# Patient Record
Sex: Female | Born: 1945 | Race: White | Hispanic: No | Smoking: Former smoker
Health system: Southern US, Community
[De-identification: ages and names within clinical notes are randomized; demographics above are authoritative.]

## PROBLEM LIST (undated history)

## (undated) DIAGNOSIS — I872 Venous insufficiency (chronic) (peripheral): Secondary | ICD-10-CM

## (undated) DIAGNOSIS — M8580 Other specified disorders of bone density and structure, unspecified site: Secondary | ICD-10-CM

## (undated) DIAGNOSIS — IMO0002 Reserved for concepts with insufficient information to code with codable children: Secondary | ICD-10-CM

## (undated) DIAGNOSIS — T7840XA Allergy, unspecified, initial encounter: Secondary | ICD-10-CM

## (undated) DIAGNOSIS — M159 Polyosteoarthritis, unspecified: Secondary | ICD-10-CM

## (undated) DIAGNOSIS — E785 Hyperlipidemia, unspecified: Secondary | ICD-10-CM

## (undated) HISTORY — DX: Venous insufficiency (chronic) (peripheral): I87.2

## (undated) HISTORY — DX: Other specified disorders of bone density and structure, unspecified site: M85.80

## (undated) HISTORY — DX: Polyosteoarthritis, unspecified: M15.9

## (undated) HISTORY — PX: CHOLECYSTECTOMY: SHX55

## (undated) HISTORY — DX: Allergy, unspecified, initial encounter: T78.40XA

## (undated) HISTORY — PX: NASAL SEPTUM SURGERY: SHX37

## (undated) HISTORY — PX: ABDOMINAL HYSTERECTOMY: SHX81

## (undated) HISTORY — PX: COLONOSCOPY: SHX174

## (undated) HISTORY — DX: Hyperlipidemia, unspecified: E78.5

## (undated) HISTORY — DX: Reserved for concepts with insufficient information to code with codable children: IMO0002

---

## 1998-11-12 DIAGNOSIS — IMO0002 Reserved for concepts with insufficient information to code with codable children: Secondary | ICD-10-CM

## 1998-11-12 HISTORY — DX: Reserved for concepts with insufficient information to code with codable children: IMO0002

## 2000-10-14 ENCOUNTER — Encounter: Payer: Self-pay | Admitting: Internal Medicine

## 2003-03-17 ENCOUNTER — Encounter: Payer: Self-pay | Admitting: Internal Medicine

## 2004-08-31 ENCOUNTER — Ambulatory Visit: Payer: Self-pay | Admitting: Internal Medicine

## 2004-10-17 ENCOUNTER — Ambulatory Visit: Payer: Self-pay | Admitting: Internal Medicine

## 2005-08-02 ENCOUNTER — Ambulatory Visit: Payer: Self-pay | Admitting: Internal Medicine

## 2005-08-13 ENCOUNTER — Ambulatory Visit: Payer: Self-pay | Admitting: Internal Medicine

## 2005-08-21 ENCOUNTER — Ambulatory Visit: Payer: Self-pay | Admitting: Gastroenterology

## 2005-08-29 ENCOUNTER — Ambulatory Visit: Payer: Self-pay | Admitting: Internal Medicine

## 2005-09-04 ENCOUNTER — Ambulatory Visit: Payer: Self-pay | Admitting: Gastroenterology

## 2006-01-31 ENCOUNTER — Ambulatory Visit: Payer: Self-pay | Admitting: Internal Medicine

## 2006-03-07 ENCOUNTER — Ambulatory Visit: Payer: Self-pay | Admitting: Internal Medicine

## 2006-08-05 ENCOUNTER — Ambulatory Visit: Payer: Self-pay | Admitting: Internal Medicine

## 2006-08-14 ENCOUNTER — Ambulatory Visit: Payer: Self-pay | Admitting: Internal Medicine

## 2007-07-23 DIAGNOSIS — M949 Disorder of cartilage, unspecified: Secondary | ICD-10-CM

## 2007-07-23 DIAGNOSIS — I872 Venous insufficiency (chronic) (peripheral): Secondary | ICD-10-CM | POA: Insufficient documentation

## 2007-07-23 DIAGNOSIS — J309 Allergic rhinitis, unspecified: Secondary | ICD-10-CM | POA: Insufficient documentation

## 2007-07-23 DIAGNOSIS — N3090 Cystitis, unspecified without hematuria: Secondary | ICD-10-CM | POA: Insufficient documentation

## 2007-07-23 DIAGNOSIS — M899 Disorder of bone, unspecified: Secondary | ICD-10-CM | POA: Insufficient documentation

## 2007-08-07 ENCOUNTER — Ambulatory Visit: Payer: Self-pay | Admitting: Internal Medicine

## 2007-08-07 LAB — CONVERTED CEMR LAB
BUN: 20 mg/dL (ref 6–23)
CO2: 30 meq/L (ref 19–32)
Creatinine, Ser: 0.9 mg/dL (ref 0.4–1.2)
Glucose, Bld: 90 mg/dL (ref 70–99)
Phosphorus: 3.1 mg/dL (ref 2.3–4.6)
Potassium: 3.8 meq/L (ref 3.5–5.1)
Sodium: 139 meq/L (ref 135–145)

## 2007-08-19 ENCOUNTER — Encounter: Payer: Self-pay | Admitting: Internal Medicine

## 2007-08-19 ENCOUNTER — Ambulatory Visit: Payer: Self-pay | Admitting: Internal Medicine

## 2007-08-25 ENCOUNTER — Encounter (INDEPENDENT_AMBULATORY_CARE_PROVIDER_SITE_OTHER): Payer: Self-pay | Admitting: *Deleted

## 2007-09-05 ENCOUNTER — Telehealth (INDEPENDENT_AMBULATORY_CARE_PROVIDER_SITE_OTHER): Payer: Self-pay | Admitting: *Deleted

## 2007-11-04 ENCOUNTER — Ambulatory Visit: Payer: Self-pay | Admitting: Internal Medicine

## 2007-11-07 ENCOUNTER — Ambulatory Visit: Payer: Self-pay | Admitting: *Deleted

## 2008-01-05 ENCOUNTER — Telehealth (INDEPENDENT_AMBULATORY_CARE_PROVIDER_SITE_OTHER): Payer: Self-pay | Admitting: *Deleted

## 2008-05-07 ENCOUNTER — Telehealth (INDEPENDENT_AMBULATORY_CARE_PROVIDER_SITE_OTHER): Payer: Self-pay | Admitting: *Deleted

## 2008-08-10 ENCOUNTER — Ambulatory Visit: Payer: Self-pay | Admitting: Internal Medicine

## 2008-08-12 LAB — CONVERTED CEMR LAB
Albumin: 3.7 g/dL (ref 3.5–5.2)
BUN: 22 mg/dL (ref 6–23)
CO2: 29 meq/L (ref 19–32)
Calcium: 9.1 mg/dL (ref 8.4–10.5)
Direct LDL: 171.4 mg/dL
Eosinophils Relative: 1.7 % (ref 0.0–5.0)
GFR calc non Af Amer: 77 mL/min
Glucose, Bld: 84 mg/dL (ref 70–99)
HCT: 41.7 % (ref 36.0–46.0)
Hemoglobin: 14.7 g/dL (ref 12.0–15.0)
Lymphocytes Relative: 38.3 % (ref 12.0–46.0)
Monocytes Absolute: 0.5 10*3/uL (ref 0.1–1.0)
Monocytes Relative: 10.2 % (ref 3.0–12.0)
Neutro Abs: 2.5 10*3/uL (ref 1.4–7.7)
Potassium: 3.9 meq/L (ref 3.5–5.1)
RDW: 12.1 % (ref 11.5–14.6)
Sodium: 137 meq/L (ref 135–145)
Total CHOL/HDL Ratio: 6.3
VLDL: 62 mg/dL — ABNORMAL HIGH (ref 0–40)
WBC: 5 10*3/uL (ref 4.5–10.5)

## 2008-08-19 ENCOUNTER — Encounter: Payer: Self-pay | Admitting: Internal Medicine

## 2008-08-19 ENCOUNTER — Ambulatory Visit: Payer: Self-pay | Admitting: Internal Medicine

## 2008-08-31 ENCOUNTER — Encounter: Payer: Self-pay | Admitting: Internal Medicine

## 2008-08-31 ENCOUNTER — Ambulatory Visit: Payer: Self-pay | Admitting: Internal Medicine

## 2009-01-05 ENCOUNTER — Ambulatory Visit: Payer: Self-pay | Admitting: Family Medicine

## 2009-01-05 LAB — CONVERTED CEMR LAB
Glucose, Urine, Semiquant: NEGATIVE
Ketones, urine, test strip: NEGATIVE
Specific Gravity, Urine: 1.015
pH: 6.5

## 2009-01-06 ENCOUNTER — Encounter (INDEPENDENT_AMBULATORY_CARE_PROVIDER_SITE_OTHER): Payer: Self-pay | Admitting: Internal Medicine

## 2009-08-17 ENCOUNTER — Ambulatory Visit: Payer: Self-pay | Admitting: Internal Medicine

## 2009-08-17 DIAGNOSIS — E785 Hyperlipidemia, unspecified: Secondary | ICD-10-CM | POA: Insufficient documentation

## 2009-08-19 LAB — CONVERTED CEMR LAB
Alkaline Phosphatase: 63 units/L (ref 39–117)
BUN: 22 mg/dL (ref 6–23)
Bilirubin, Direct: 0.1 mg/dL (ref 0.0–0.3)
CO2: 29 meq/L (ref 19–32)
Calcium: 9.7 mg/dL (ref 8.4–10.5)
Chloride: 99 meq/L (ref 96–112)
Creatinine, Ser: 0.8 mg/dL (ref 0.4–1.2)
Direct LDL: 179.1 mg/dL
Eosinophils Absolute: 0.1 10*3/uL (ref 0.0–0.7)
GFR calc non Af Amer: 76.88 mL/min (ref >60)
HCT: 43.5 % (ref 36.0–46.0)
HDL: 45.3 mg/dL (ref >39.00)
Hemoglobin: 15.2 g/dL — ABNORMAL HIGH (ref 12.0–15.0)
MCV: 91.6 fL (ref 78.0–100.0)
Monocytes Relative: 9.8 % (ref 3.0–12.0)
Potassium: 4 meq/L (ref 3.5–5.1)
RDW: 12.3 % (ref 11.5–14.6)
TSH: 2.89 microintl units/mL (ref 0.35–5.50)
Total Bilirubin: 1.3 mg/dL — ABNORMAL HIGH (ref 0.3–1.2)
Total CHOL/HDL Ratio: 6
VLDL: 60.4 mg/dL — ABNORMAL HIGH (ref 0.0–40.0)

## 2009-09-06 ENCOUNTER — Ambulatory Visit: Payer: Self-pay | Admitting: Internal Medicine

## 2009-09-06 ENCOUNTER — Encounter: Payer: Self-pay | Admitting: Internal Medicine

## 2009-09-09 ENCOUNTER — Encounter: Payer: Self-pay | Admitting: Internal Medicine

## 2009-09-26 ENCOUNTER — Ambulatory Visit: Payer: Self-pay | Admitting: Internal Medicine

## 2010-02-07 ENCOUNTER — Ambulatory Visit: Payer: Self-pay | Admitting: Family Medicine

## 2010-02-07 LAB — CONVERTED CEMR LAB: Rapid Strep: NEGATIVE

## 2010-07-10 ENCOUNTER — Ambulatory Visit: Payer: Self-pay | Admitting: Internal Medicine

## 2010-10-09 ENCOUNTER — Ambulatory Visit: Payer: Self-pay | Admitting: Internal Medicine

## 2010-10-13 LAB — CONVERTED CEMR LAB
ALT: 18 units/L (ref 0–35)
AST: 24 units/L (ref 0–37)
BUN: 26 mg/dL — ABNORMAL HIGH (ref 6–23)
Basophils Absolute: 0 10*3/uL (ref 0.0–0.1)
Bilirubin, Direct: 0.1 mg/dL (ref 0.0–0.3)
Calcium: 9.5 mg/dL (ref 8.4–10.5)
Creatinine, Ser: 0.9 mg/dL (ref 0.4–1.2)
Direct LDL: 150.4 mg/dL
Eosinophils Absolute: 0.1 10*3/uL (ref 0.0–0.7)
GFR calc non Af Amer: 70.46 mL/min (ref >60)
Glucose, Bld: 95 mg/dL (ref 70–99)
HDL: 48.6 mg/dL (ref >39.00)
Hemoglobin: 14.4 g/dL (ref 12.0–15.0)
Lymphocytes Relative: 35 % (ref 12.0–46.0)
MCHC: 34.9 g/dL (ref 30.0–36.0)
Monocytes Relative: 9.2 % (ref 3.0–12.0)
Neutrophils Relative %: 53.3 % (ref 43.0–77.0)
Phosphorus: 3.5 mg/dL (ref 2.3–4.6)
Platelets: 257 10*3/uL (ref 150.0–400.0)
Potassium: 4.2 meq/L (ref 3.5–5.1)
RDW: 12.5 % (ref 11.5–14.6)
TSH: 3.15 microintl units/mL (ref 0.35–5.50)
Total Protein: 7 g/dL (ref 6.0–8.3)

## 2010-10-18 ENCOUNTER — Ambulatory Visit: Payer: Self-pay | Admitting: Sports Medicine

## 2010-10-18 DIAGNOSIS — M719 Bursopathy, unspecified: Secondary | ICD-10-CM

## 2010-10-18 DIAGNOSIS — M67919 Unspecified disorder of synovium and tendon, unspecified shoulder: Secondary | ICD-10-CM | POA: Insufficient documentation

## 2010-10-18 DIAGNOSIS — M25519 Pain in unspecified shoulder: Secondary | ICD-10-CM | POA: Insufficient documentation

## 2010-11-01 ENCOUNTER — Ambulatory Visit: Payer: Self-pay | Admitting: Sports Medicine

## 2010-11-08 ENCOUNTER — Ambulatory Visit: Payer: Self-pay | Admitting: Internal Medicine

## 2010-11-08 ENCOUNTER — Encounter: Payer: Self-pay | Admitting: Internal Medicine

## 2010-11-09 ENCOUNTER — Encounter: Payer: Self-pay | Admitting: Internal Medicine

## 2010-12-11 ENCOUNTER — Ambulatory Visit
Admission: RE | Admit: 2010-12-11 | Discharge: 2010-12-11 | Payer: Self-pay | Source: Home / Self Care | Attending: Internal Medicine | Admitting: Internal Medicine

## 2010-12-12 NOTE — Assessment & Plan Note (Signed)
Summary: CPX/CLE   Vital Signs:  Patient profile:   65 year old female Weight:      172 pounds Temp:     98.4 degrees F oral Pulse rate:   64 / minute Pulse rhythm:   regular BP sitting:   118 / 70  (left arm) Cuff size:   regular  Vitals Entered By: Mervin Hack CMA Duncan Dull) (October 09, 2010 3:14 PM) CC: adult physical   History of Present Illness: DOing okay in general  Still having some trouble going down steps---thinks it may have been sciatica since it went down lateral leg. Now mostly better Is going to see ortho next week Also problems with left shoulder---esp posteriorly  Partner has not been doing that great appetite is off and losing weight Is seen quarterly by oncology for his lung cancer  still gets the swelling in legs uses compression hose and the diuretic daily  Allergies: 1)  ! Macrodantin (Nitrofurantoin Macrocrystal)  Past History:  Past medical, surgical, family and social histories (including risk factors) reviewed for relevance to current acute and chronic problems.  Past Medical History: Reviewed history from 08/17/2009 and no changes required. Allergic rhinitis Osteopenia Recurrent cystitis Squamous cell carcinoma of right leg-- ~2000 Chronic venous insufficiency Hyperlipidemia  Past Surgical History: Reviewed history from 07/23/2007 and no changes required. Hysterectomy Nasal septum repair DEXA 10/03 DEXA- fairly stable 10/05  Family History: Reviewed history from 07/23/2007 and no changes required. Dad died @44  MI Mom with RA and HTN Mat aunt died of some cancer Mat GM died of CVA  Social History: Reviewed history from 08/07/2007 and no changes required. 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 Eyes:  Denies double vision and vision loss-1 eye. ENT:  Denies decreased hearing and ringing in ears; teeth okay--regular wtih dentist. CV:  Complains of palpitations; denies chest pain or discomfort, difficulty breathing at night, difficulty breathing while lying down, fainting, lightheadness, and shortness of breath with exertion; rare racing sensation at night--may be just that she can hear it. Very brief. Resp:  Denies cough and shortness of breath. GI:  Denies abdominal pain, bloody stools, change in bowel habits, dark tarry stools, indigestion, nausea, and vomiting. GU:  Complains of nocturia; denies dysuria and incontinence; occ nocturia No sexual problems. MS:  See HPI; Complains of joint pain; denies joint swelling; toe pain also tylenol helps when she takes it--she thinks. Derm:  Denies lesion(s) and rash; some "folliculitis" in hair--variable. Neuro:  Denies headaches, numbness, tingling, and weakness. Psych:  Denies anxiety and depression; has lots of stress wtih mom, daughter and her caregiving. Heme:  Denies abnormal bruising and enlarge lymph nodes. Allergy:  Denies seasonal allergies and sneezing.  Physical Exam  General:  alert and normal appearance.   Eyes:  pupils equal, pupils round, pupils reactive to light, and no optic disk abnormalities.   Ears:  R ear normal and L ear normal.   Mouth:  no erythema, no exudates, and no lesions.   Neck:  supple, no masses, no thyromegaly, no carotid bruits, and no cervical lymphadenopathy.   Breasts:  no abnormal thickening, no tenderness, and no adenopathy.  Mild bilateral cystic changes Lungs:  normal respiratory effort, no intercostal retractions, no accessory muscle use, and  normal breath sounds.   Heart:  normal rate, regular rhythm, no murmur, and no gallop.   Abdomen:  soft, non-tender, and no masses.   Msk:  no joint tenderness and no joint swelling.    Pulses:  1+ in feet Extremities:  no edema Neurologic:  alert & oriented X3, strength normal in all extremities, and gait normal.   Skin:  no rashes, no suspicious lesions, and no ulcerations.   Psych:  normally interactive, good eye contact, not anxious appearing, and not depressed appearing.     Impression & Recommendations:  Problem # 1:  PREVENTIVE HEALTH CARE (ICD-V70.0) Assessment Comment Only doing okay lots of ongoing caregiver stress discussed fitness due for mammo  Problem # 2:  VENOUS INSUFFICIENCY (ICD-459.81) Assessment: Comment Only  okay with diuretic will check labs  Orders: TLB-Renal Function Panel (80069-RENAL) TLB-CBC Platelet - w/Differential (85025-CBCD) TLB-TSH (Thyroid Stimulating Hormone) (84443-TSH)  Problem # 3:  HYPERLIPIDEMIA (ICD-272.4) Assessment: Comment Only  on fish oil will recheck no other meds for now  Labs Reviewed: SGOT: 37 (08/17/2009)   SGPT: 36 (08/17/2009)   HDL:45.30 (08/17/2009), 42.7 (08/10/2008)  LDL:DEL (08/10/2008)  Chol:285 (08/17/2009), 270 (08/10/2008)  Trig:302.0 (08/17/2009), 312 (08/10/2008)  Orders: TLB-Lipid Panel (80061-LIPID) TLB-Hepatic/Liver Function Pnl (80076-HEPATIC) Venipuncture (04540)  Complete Medication List: 1)  Premarin 0.625 Mg Tabs (Estrogens conjugated) .... Take one by mouth monday and thursday 2)  Triamterene-hctz 37.5-25 Mg Caps (Triamterene-hctz) .... Take one by mouth once a day 3)  Multivitamins Tabs (Multiple vitamin) .... Take one by mouth once a day 4)  Fish Oil 1200 Mg Caps (Omega-3 fatty acids) .... Take one by mouth daily 5)  Calcium 500 Mg Tabs (Calcium carbonate) .... Take 1 by mouth once daily 6)  Vitamin D 1000 Unit Tabs (Cholecalciferol) .... Take 2 by mouth once daily 7)  Melatonin 3 Mg Tabs (Melatonin) .... Take 1 by mouth once daily  Other Orders: Radiology Referral (Radiology)  Patient Instructions: 1)  Please schedule a follow-up appointment in 1 year.  2)   Schedule your mammogram.    Orders Added: 1)  Est. Patient 40-64 years [99396] 2)  Radiology Referral [Radiology] 3)  TLB-Lipid Panel [80061-LIPID] 4)  TLB-Hepatic/Liver Function Pnl [80076-HEPATIC] 5)  Venipuncture [36415] 6)  TLB-Renal Function Panel [80069-RENAL] 7)  TLB-CBC Platelet - w/Differential [85025-CBCD] 8)  TLB-TSH (Thyroid Stimulating Hormone) [98119-JYN]   Immunization History:  Influenza Immunization History:    Influenza:  historical (08/12/2010)   Immunization History:  Influenza Immunization History:    Influenza:  Historical (08/12/2010)  Current Allergies (reviewed today): ! MACRODANTIN (NITROFURANTOIN MACROCRYSTAL)

## 2010-12-12 NOTE — Assessment & Plan Note (Signed)
Summary: ST,EAR PAIN/CLE   Vital Signs:  Patient profile:   65 year old female Height:      67 inches Weight:      174.6 pounds BMI:     27.45 Temp:     98.0 degrees F oral Pulse rate:   68 / minute Pulse rhythm:   regular BP sitting:   108 / 78  (left arm) Cuff size:   regular  Vitals Entered By: Benny Lennert CMA [facility]) (February 07, 2010 10:16 AM)  History of Present Illness: Chief complaint Sore throat and ear pain on left side  Acute Visit History:      The patient complains of earache and sore throat.  These symptoms began one day ago.  She denies chest pain, cough, fever, and sinus problems.  Other comments include: left throat, left ear pain grandaughter with strep several weeks ago. .        The earache is located on the left side.  There is no history of recent antibiotic usage associated with the earache.        Problems Prior to Update: 1)  Cellulitis, Face  (ICD-682.0) 2)  Hyperlipidemia  (ICD-272.4) 3)  Abnormal Mammogram  (ICD-793.80) 4)  Screening For Lipoid Disorders  (ICD-V77.91) 5)  Uri  (ICD-465.9) 6)  Preventive Health Care  (ICD-V70.0) 7)  Screening For Mlig Neop, Breast, Nos  (ICD-V76.10) 8)  Carcinoma, Squamous Cell, Hx Of, Right Leg  (ICD-V10.9) 9)  Venous Insufficiency  (ICD-459.81) 10)  Cystitis, Recurrent  (ICD-595.9) 11)  Osteopenia  (ICD-733.90) 12)  Allergic Rhinitis  (ICD-477.9)  Current Medications (verified): 1)  Premarin 0.625 Mg Tabs (Estrogens Conjugated) .... Take One By Mouth Monday and Thursday 2)  Triamterene-Hctz 37.5-25 Mg Caps (Triamterene-Hctz) .... Take One By Mouth Once A Day 3)  Multivitamins   Tabs (Multiple Vitamin) .... Take One By Mouth Once A Day 4)  Fish Oil 1200 Mg Caps (Omega-3 Fatty Acids) .... Take One By Mouth Daily 5)  Calcium 500 Mg Tabs (Calcium Carbonate) .... Take 1 By Mouth Once Daily 6)  Vitamin D 1000 Unit Tabs (Cholecalciferol) .... Take 1 By Mouth Once Daily 7)  Melatonin 3 Mg Tabs (Melatonin) .... Take  1 By Mouth Once Daily  Allergies: 1)  ! Macrodantin (Nitrofurantoin Macrocrystal)  Past History:  Past medical, surgical, family and social histories (including risk factors) reviewed, and no changes noted (except as noted below).  Past Medical History: Reviewed history from 08/17/2009 and no changes required. Allergic rhinitis Osteopenia Recurrent cystitis Squamous cell carcinoma of right leg-- ~2000 Chronic venous insufficiency Hyperlipidemia  Past Surgical History: Reviewed history from 07/23/2007 and no changes required. Hysterectomy Nasal septum repair DEXA 10/03 DEXA- fairly stable 10/05  Family History: Reviewed history from 07/23/2007 and no changes required. Dad died @44  MI Mom with RA and HTN Mat aunt died of some cancer Mat GM died of CVA  Social History: Reviewed history from 08/07/2007 and no changes required. 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 CV:  Denies chest pain or discomfort. Resp:  Denies shortness of breath. GI:  Denies abdominal pain.  Physical Exam  General:  Well-developed,well-nourished,in no acute distress; alert,appropriate and cooperative throughout examination Head:  no maxillary sinus ttp Nose:  External nasal examination shows no deformity or inflammation. Nasal mucosa are pink and moist  without lesions or exudates. Mouth:  no exudates, no posterior lymphoid hypertrophy, no postnasal drip, and pharyngeal erythema.   Neck:  no carotid bruit or thyromegaly no cervical or supraclavicular lymphadenopathy  Lungs:  Normal respiratory effort, chest expands symmetrically. Lungs are clear to auscultation, no crackles or wheezes. Heart:  Normal rate and regular rhythm. S1 and S2 normal without gallop, murmur, click, rub or  other extra sounds.   Impression & Recommendations:  Problem # 1:  URI (ICD-465.9)  Viral pharyngitis.  Instructed on symptomatic treatment. Call if symptoms persist or worsen.  Discussed viral timeline.   Orders: Rapid Strep (84696)  Complete Medication List: 1)  Premarin 0.625 Mg Tabs (Estrogens conjugated) .... Take one by mouth monday and thursday 2)  Triamterene-hctz 37.5-25 Mg Caps (Triamterene-hctz) .... Take one by mouth once a day 3)  Multivitamins Tabs (Multiple vitamin) .... Take one by mouth once a day 4)  Fish Oil 1200 Mg Caps (Omega-3 fatty acids) .... Take one by mouth daily 5)  Calcium 500 Mg Tabs (Calcium carbonate) .... Take 1 by mouth once daily 6)  Vitamin D 1000 Unit Tabs (Cholecalciferol) .... Take 1 by mouth once daily 7)  Melatonin 3 Mg Tabs (Melatonin) .... Take 1 by mouth once daily  Laboratory Results    Other Tests  Rapid Strep: negative  Kit Test Internal QC: Negative   (Normal Range: Negative)   Current Allergies (reviewed today): ! MACRODANTIN (NITROFURANTOIN MACROCRYSTAL)

## 2010-12-12 NOTE — Assessment & Plan Note (Signed)
Summary: 2:15 PAIN IN KNEES/CLE r/s from 07/03/10   Vital Signs:  Patient profile:   65 year old female Weight:      175 pounds Temp:     98.2 degrees F oral Pulse rate:   72 / minute Pulse rhythm:   regular BP sitting:   110 / 68  (left arm) Cuff size:   regular  Vitals Entered By: Mervin Hack CMA [facility] Dull) (July 10, 2010 2:19 PM) CC: knee pain [id number]   History of Present Illness: Having trouble getting up steps Gets pain along lateral legs above knee and into calves mostly diffuse on right Does have pain more localized along lateral left knee  Started a couple of months ago no injury Same shoes no new tasks  No falls Still able to get up steps Has been very sedentary for the past 6 months or so  Allergies: 1)  ! Macrodantin (Nitrofurantoin Macrocrystal)  Past History:  Past medical, surgical, family and social histories (including risk factors) reviewed for relevance to current acute and chronic problems.  Past Medical History: Reviewed history from 08/17/2009 and no changes required. Allergic rhinitis Osteopenia Recurrent cystitis Squamous cell carcinoma of right leg-- ~2000 Chronic venous insufficiency Hyperlipidemia  Past Surgical History: Reviewed history from 07/23/2007 and no changes required. Hysterectomy Nasal septum repair DEXA 10/03 DEXA- fairly stable 10/05  Family History: Reviewed history from 07/23/2007 and no changes required. Dad died @44  MI Mom with RA and HTN Mat aunt died of some cancer Mat GM died of CVA  Social History: Reviewed history from 08/07/2007 and no changes required. 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   Msk:  Normal ROM at both hips and knees No meniscus or ligament findings in either knee ??very slight effusion on right (not clear cut) Pulses:  1+ in each foot Extremities:  no sig edema with hose on no calf tenderness Neurologic:  strength normal in all extremities and gait normal.   Psych:  normally interactive, good eye contact, not anxious appearing, and not depressed appearing.     Impression & Recommendations:  Problem # 1:  LEG PAIN (ICD-729.5) Assessment New new problem but limited findings not clearly arthritis No obvious sciatica Knees are stable Hip ROM normal without pain  may just be muscular problems going up steps could indicate meniscus problems but nothing clear cut on exam  P: discussed exercise regimen      will set up with Dr Patsy Lager if doesn't improve  Complete Medication List: 1)  Premarin 0.625 Mg Tabs (Estrogens conjugated) .... Take one by mouth monday and thursday 2)  Triamterene-hctz 37.5-25 Mg Caps (Triamterene-hctz) .... Take one by mouth once a day 3)  Multivitamins Tabs (Multiple vitamin) .... Take one by mouth once a day 4)  Fish Oil 1200 Mg Caps (Omega-3 fatty acids) .... Take one by mouth daily 5)  Calcium 500 Mg Tabs (Calcium carbonate) .... Take 1 by mouth once daily 6)  Vitamin D 1000 Unit Tabs (Cholecalciferol) .... Take 2 by mouth once daily 7)  Melatonin 3 Mg Tabs (Melatonin) .... Take 1 by mouth once daily  Patient Instructions: 1)  Please call  for an appt with Dr Patsy Lager if your legs are not feeling better in the next few weeks 2)  Please keep appt for physical  Current Allergies (reviewed today): ! MACRODANTIN (NITROFURANTOIN MACROCRYSTAL)

## 2010-12-12 NOTE — Assessment & Plan Note (Signed)
Summary: NP,LEG PAIN,MC   Vital Signs:  Patient profile:   65 year old female Height:      67 inches Weight:      170 pounds BP sitting:   119 / 78  Vitals Entered By: Lillia Pauls CMA (October 18, 2010 10:03 AM)  Primary Provider:  Alphonsus Sias    History of Present Illness: Patient has had sxs that cause RT knee issues feels unstable going up steps cautious w full weight sometimes same going down steps and bending knee  - that feels more like MM pain pain  will go to lat calf and thigh at times no swelling, locking or redness still feels unstable  Lt shoulder good movement but if she reaches back to put on a coat pain shoots down into upper arm  Neck  some tightness and worse w stress/ no numbness into fingers  Allergies: 1)  ! Macrodantin (Nitrofurantoin Macrocrystal)  Physical Exam  General:  Well-developed,well-nourished,in no acute distress; alert,appropriate and cooperative throughout examination Msk:  Rt knee suprapatellar pouch fullness, none on left rt knee slightly warm left knee exam shows no effusion; stable ligaments; negative Mcmurray's and provocative meniscal tests; non painful patellar compression; patellar and quadriceps tendons unremarkable. Rt knee full flexion, negative Mcmurray's Lt hip abduction strong Rt hip abduction weak Neg rt straight leg raise with very good flexibility  Full abduction and elevation of both shoulders Good internal rotation bilat shoulders Limited external rotation of left shoulder Weakness on external rotation on left shoulder Good strength on internal rotation bilat Neg empty can Good abduction and elevation strength  Additional Exam:  MSK [place] no tears noted in either med or lat meniscus pateallr tendon has small tear prox that appears old no inc in blood flow quad tendon intact small increase in fluid in suprapatellar pouch   Impression & Recommendations:  Problem # 1:  KNEE PAIN, RIGHT (ICD-719.46)  I  recommended that we try to strengthen hip abductor on RT shich may make knee seem unstable as it is very weak do some quad work as well  reck if persistent pain or mechanical sxs  Orders: [place] LIMITED (04540)  Problem # 2:  SHOULDER PAIN, LEFT (ICD-719.41)  this is very consistently related to external rotation  that is confirmed on exam  will work this with motion exercise and light strength work  Orders: [place] LIMITED (98119)  Problem # 3:  ROTATOR CUFF SYNDROME, LEFT (ICD-726.10)  clearly has involvment of infrapinatus and teres minor on testing  at her age RR of rotator cuff tear is high w her level of weakness  will bring back for scan of this  Orders: Theraband per yard (A9300) [place] LIMITED [phone])  Complete Medication List: 1)  Premarin 0.625 Mg Tabs (Estrogens conjugated) .... Take one by mouth monday and thursday 2)  Triamterene-hctz 37.5-25 Mg Caps (Triamterene-hctz) .... Take one by mouth once a day 3)  Multivitamins Tabs (Multiple vitamin) .... Take one by mouth once a day 4)  Fish Oil 1200 Mg Caps (Omega-3 fatty acids) .... Take one by mouth daily 5)  Calcium 500 Mg Tabs (Calcium carbonate) .... Take 1 by mouth once daily 6)  Vitamin D 1000 Unit Tabs (Cholecalciferol) .... Take 2 by mouth once daily 7)  Melatonin 3 Mg Tabs (Melatonin) .... Take 1 by mouth once daily  Patient Instructions: 1)  Do lateral leg lifts lying on your side- start with 3 sets of 6, and build up to 3 sets of 10  2)  Do standing hip rotations - start with 3 sets of 6, building up to 3 sets of 10 3)  Do straight leg raises- start with 3 sets of 6, building up to 3 sets of 10 4)  Use theraband for external shoulder rotation exercises    Orders Added: 1)  Theraband per yard [A9300] 2)  New Patient Level III [99203] 3)  [place] LIMITED [[id number]]

## 2010-12-14 NOTE — Letter (Signed)
Summary: Results Follow up Letter  [name] [facility]  [address] [place], [place] [address]   Phone: [phone]  Fax: [phone]    11/09/2010 MRN: [id number]  [facility] [address] [place], [place]  [address]  Dear [name],  The following are the results of your recent test(s):  Test         Result    Pap Smear:        Normal _____  Not Normal _____ Comments: ______________________________________________________ Cholesterol: LDL(Bad cholesterol):         Your goal is less than:         HDL (Good cholesterol):       Your goal is more than: Comments:  ______________________________________________________ Mammogram:        Normal __X___  Not Normal _____ Comments:mammo looks fine, repeat recommended in 1-2 years  ___________________________________________________________________ Hemoccult:        Normal _____  Not normal _______ Comments:    _____________________________________________________________________ Other Tests:    We routinely do not discuss normal results over the telephone.  If you desire a copy of the results, or you have any questions about this information we can discuss them at your next office visit.   Sincerely,      Tillman Abide, MD

## 2010-12-14 NOTE — Assessment & Plan Note (Signed)
Summary: [facility]   Primary Provider:  Alphonsus Sias    History of Present Illness: 65 yo F here for Lt shoulder [place]. Seen by Darrick Penna 2 weeks ago for knee pain, but also with shoulder pain now.  Pain with reaching back and overhead. No pain with sleeping on that side.  No restricted ROM.  Has noticed weakness but only mildly.  Fields concerned about RC tear so wanted her to come back for [place]  Allergies: 1)  ! Macrodantin (Nitrofurantoin Macrocrystal)  Physical Exam  General:  Well-developed,well-nourished,in no acute distress; alert,appropriate and cooperative throughout examination Msk:  Lt Shoulder: Inspection reveals no abnormalities, atrophy or asymmetry. Palpation is normal with no tenderness over AC joint or bicipital groove. ROM is full in f flex, abd, IR, but limited to 45 ER with painful endpoint (compared to 75 deg on Rt side) Rotator cuff strength normal throughout. No signs of impingement with negative Neer and Hawkin's tests, empty can. Speeds and Yergason's tests normal. No labral pathology noted with negative Obrien's. Normal scapular function observed. No painful arc and no drop arm sign.  Shoulder [place] limited: BT nl.  Subscap nl, no internal impingement.  SSP with some mild overlying bursitis, no obvious tear, may has small amount of subacromial impingement.  TM and ISP appear normal. AC joint normal.    Impression & Recommendations:  Problem # 1:  ROTATOR CUFF SYNDROME, LEFT (ICD-726.10)  No obvious tear on [place], does not meet criteria for adhesive capsulitis.  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 Take one by mouth monday and thursday 2)  Triamterene-hctz 37.5-25 Mg Caps (Triamterene-hctz) .... Take one by mouth once a day 3)   Multivitamins Tabs (Multiple vitamin) .... Take one by mouth once a day 4)  Fish Oil 1200 Mg Caps (Omega-3 fatty acids) .... Take one by mouth daily 5)  Calcium 500 Mg Tabs (Calcium carbonate) .... Take 1 by mouth once daily 6)  Vitamin D 1000 Unit Tabs (Cholecalciferol) .... Take 2 by mouth once daily 7)  Melatonin 3 Mg Tabs (Melatonin) .... Take 1 by mouth once daily   Orders Added: 1)  Est. Patient Level III [60454] 2)  [place] LIMITED [[id number]]

## 2010-12-20 NOTE — Assessment & Plan Note (Signed)
Summary: 2:15 COUGH/CLE   Vital Signs:  Patient profile:   65 year old female Weight:      173 pounds BMI:     27.19 Temp:     98.6 degrees F oral Pulse rate:   82 / minute Pulse rhythm:   regular BP sitting:   121 / 69  (left arm) Cuff size:   regular  Vitals Entered By: Mervin Hack CMA Duncan Dull) (December 11, 2010 2:27 PM) CC: cough   History of Present Illness: Has had cough for about 4 days Doesn't seem like "traditional" cold No rhinorrhea or head congestion  Seems deep in chest not much sputum---slight green stuff this AM No SOB No sore throat No ear pain  Didn't try any meds  No fever Cough not keeping her up at night  Allergies: 1)  ! Macrodantin (Nitrofurantoin Macrocrystal)  Past History:  Past medical, surgical, family and social histories (including risk factors) reviewed for relevance to current acute and chronic problems.  Past Medical History: Reviewed history from 08/17/2009 and no changes required. Allergic rhinitis Osteopenia Recurrent cystitis Squamous cell carcinoma of right leg-- ~2000 Chronic venous insufficiency Hyperlipidemia  Past Surgical History: Reviewed history from 07/23/2007 and no changes required. Hysterectomy Nasal septum repair DEXA 10/03 DEXA- fairly stable 10/05  Family History: Reviewed history from 07/23/2007 and no changes required. Dad died @44  MI Mom with RA and HTN Mat aunt died of some cancer Mat GM died of CVA  Social History: Reviewed history from 08/07/2007 and no changes required. 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   Head:  No maxillary or frontal tenderness Ears:   R ear normal and L ear normal.   Nose:  mild congestion and inflammation Mouth:  no erythema and no exudates.   Neck:  supple, no masses, and no cervical lymphadenopathy.   Lungs:  normal respiratory effort, no intercostal retractions, no accessory muscle use, normal breath sounds, no crackles, and no wheezes.     Impression & Recommendations:  Problem # 1:  BRONCHITIS- ACUTE (ICD-466.0) Assessment New seems to be viral discussed supportive Rx  she will call in 5-8 days if signs of secondary bacterial infection (would Rx with z-pak)--discussed these with her  Complete Medication List: 1)  Premarin 0.625 Mg Tabs (Estrogens conjugated) .... Take one by mouth monday and thursday 2)  Triamterene-hctz 37.5-25 Mg Caps (Triamterene-hctz) .... Take one by mouth once a day 3)  Multivitamins Tabs (Multiple vitamin) .... Take one by mouth once a day 4)  Fish Oil 1200 Mg Caps (Omega-3 fatty acids) .... Take one by mouth daily 5)  Calcium 500 Mg Tabs (Calcium carbonate) .... Take 1 by mouth once daily 6)  Vitamin D 1000 Unit Tabs (Cholecalciferol) .... Take 2 by mouth once daily 7)  Melatonin 3 Mg Tabs (Melatonin) .... Take 1 by mouth once daily  Patient Instructions: 1)  Please schedule a follow-up appointment as needed .    Orders Added: 1)  Est. Patient Level III [16109]    Current Allergies (reviewed today): ! MACRODANTIN (NITROFURANTOIN MACROCRYSTAL)

## 2011-04-23 ENCOUNTER — Other Ambulatory Visit: Payer: Self-pay | Admitting: *Deleted

## 2011-04-23 MED ORDER — ESTROGENS CONJUGATED 0.625 MG PO TABS: ORAL_TABLET | ORAL | Status: DC

## 2011-04-23 NOTE — Telephone Encounter (Signed)
rx sent to pharmacy by e-script  

## 2011-10-15 ENCOUNTER — Ambulatory Visit (INDEPENDENT_AMBULATORY_CARE_PROVIDER_SITE_OTHER): Payer: Self-pay | Admitting: Internal Medicine

## 2011-10-15 ENCOUNTER — Encounter: Payer: Self-pay | Admitting: Internal Medicine

## 2011-10-15 VITALS — BP 121/66 | HR 89 | Temp 98.0°F | Ht 67.0 in | Wt 178.0 lb

## 2011-10-15 DIAGNOSIS — Z0000 Encounter for general adult medical examination without abnormal findings: Secondary | ICD-10-CM | POA: Insufficient documentation

## 2011-10-15 DIAGNOSIS — M949 Disorder of cartilage, unspecified: Secondary | ICD-10-CM

## 2011-10-15 DIAGNOSIS — M159 Polyosteoarthritis, unspecified: Secondary | ICD-10-CM | POA: Insufficient documentation

## 2011-10-15 DIAGNOSIS — M899 Disorder of bone, unspecified: Secondary | ICD-10-CM

## 2011-10-15 DIAGNOSIS — Z23 Encounter for immunization: Secondary | ICD-10-CM

## 2011-10-15 DIAGNOSIS — I872 Venous insufficiency (chronic) (peripheral): Secondary | ICD-10-CM

## 2011-10-15 DIAGNOSIS — Z1231 Encounter for screening mammogram for malignant neoplasm of breast: Secondary | ICD-10-CM

## 2011-10-15 DIAGNOSIS — Z79899 Other long term (current) drug therapy: Secondary | ICD-10-CM

## 2011-10-15 NOTE — Assessment & Plan Note (Signed)
Continues on diuretic and hose Check labs

## 2011-10-15 NOTE — Progress Notes (Signed)
Subjective:    Patient ID: Sierra Salinas, female    DOB: [birth year], 65 y.o.   MRN: [id number]  HPI Here for physical Still having intolerable hot flashes Went to gyn to try to get off the premarin Now on hormone patch---some reasonable control  Uses the diuretic daily and wears compression hose This keeps her legs under control  Will accept pneumovax Doesn't think she ever had chicken pox Wants to check titer and will consider vaccine if hasn't had, or zostavax if has had  No current outpatient prescriptions on file prior to visit.    Allergies  Allergen Reactions  . Nitrofurantoin     Past Medical History  Diagnosis Date  . Allergy   . Disorder of bone and cartilage, unspecified   . Cystitis     recurrent  . 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 Unspecified venous (peripheral) insufficiency   . Hyperlipidemia     Past Surgical History  Procedure Date  . Abdominal hysterectomy   . Nasal septum surgery     Family History  Problem Relation Age of Onset  . Arthritis Mother   . Hypertension Mother   . Cancer Maternal Aunt   . Stroke Maternal Grandmother     History   Social History  . Marital Status: Single    Spouse Name: N/A    Number of Children: 2  . Years of Education: N/A   Occupational History  . personal assistant    Social History Main Topics  . Smoking status: Former Games developer  . Smokeless tobacco: Never Used  . Alcohol Use: Yes     occassional  . Drug Use: No  . Sexually Active: Not on file   Other Topics Concern  . Not on file   Social History Narrative   Mostly caregives for long term companionHas living willHas designated daughter Gaylyn Rong to be health care POAWould accept resuscitation attempts---no prolonged artificial ventilationWould accept feeding tube for limited time depending on circumstances   Review of Systems  Constitutional: Negative for fatigue and unexpected weight change.       Wears seat belt  HENT: Positive  for hearing loss. Negative for congestion, rhinorrhea, dental problem and tinnitus.        Mild hearing problems in noisy room Regular with dentist  Eyes: Negative for visual disturbance.       No unilateral vision loss or diplopia  Respiratory: Negative for cough, chest tightness and shortness of breath.   Cardiovascular: Positive for leg swelling. Negative for chest pain and palpitations.  Gastrointestinal: Negative for nausea, vomiting, constipation and blood in stool.       No heartburn  Genitourinary: Negative for dysuria, urgency, difficulty urinating and dyspareunia.  Musculoskeletal: Positive for arthralgias. Negative for back pain and gait problem.       Lots of scattered arthritis---has started multiple OTC meds to help this Just started glucosamine-chondroitin---discussed giving it 1-2 months and stopping it if not helpful  Skin: Negative for rash.       Sees derm yearly  Neurological: Negative for dizziness, syncope, weakness, light-headedness, numbness and headaches.  Hematological: Negative for adenopathy. Does not bruise/bleed easily.  Psychiatric/Behavioral: Negative for sleep disturbance and dysphoric mood. The patient is not nervous/anxious.        Sleeps okay with melatonin and benedryl       Objective:   Physical Exam  Constitutional: She is oriented to person, place, and time. She appears well-developed and well-nourished. No distress.  HENT:  Head: Normocephalic and atraumatic.  Right Ear: External ear normal.  Left Ear: External ear normal.  Mouth/Throat: Oropharynx is clear and moist. No oropharyngeal exudate.       TMs normal  Eyes: Conjunctivae and EOM are normal. Pupils are equal, round, and reactive to light.       Fundi benign  Neck: Normal range of motion. Neck supple. No thyromegaly present.  Cardiovascular: Normal rate, regular rhythm, normal heart sounds and intact distal pulses.  Exam reveals no gallop.   No murmur heard. Pulmonary/Chest: Effort  normal and breath sounds normal. No respiratory distress. She has no wheezes. She has no rales.  Abdominal: Soft. There is no tenderness.  Genitourinary:       Breast have mild cystic changes but no worrisome findings  Musculoskeletal: Normal range of motion. She exhibits no edema and no tenderness.  Lymphadenopathy:    She has no cervical adenopathy.    She has no axillary adenopathy.  Neurological: She is alert and oriented to person, place, and time.  Skin: No rash noted. No erythema.       no suspicious lesions  Psychiatric: She has a normal mood and affect. Her behavior is normal. Judgment and thought content normal.          Assessment & Plan:

## 2011-10-15 NOTE — Patient Instructions (Signed)
Please set up mammogram

## 2011-10-15 NOTE — Assessment & Plan Note (Signed)
Healthy Needs to be more active---discussed Will give pneumovax Never had chicken pox---asks for titer before vaccine

## 2011-10-15 NOTE — Assessment & Plan Note (Signed)
On vitamin D Estrogen should help also

## 2011-10-15 NOTE — Assessment & Plan Note (Signed)
Uses ibuprofen or naproxen ??glucosamine--chondroitin helpful

## 2011-10-16 LAB — CBC WITH DIFFERENTIAL/PLATELET
Basophils Absolute: 0 10*3/uL (ref 0.0–0.1)
Eosinophils Absolute: 0.2 10*3/uL (ref 0.0–0.7)
HCT: 40.2 % (ref 36.0–46.0)
Hemoglobin: 13.9 g/dL (ref 12.0–15.0)
Lymphs Abs: 2.1 10*3/uL (ref 0.7–4.0)
MCHC: 34.6 g/dL (ref 30.0–36.0)
MCV: 92.9 fl (ref 78.0–100.0)
Monocytes Absolute: 0.4 10*3/uL (ref 0.1–1.0)
Neutro Abs: 3.9 10*3/uL (ref 1.4–7.7)
Platelets: 262 10*3/uL (ref 150.0–400.0)
RDW: 12.9 % (ref 11.5–14.6)

## 2011-10-16 LAB — HEPATIC FUNCTION PANEL
ALT: 20 U/L (ref 0–35)
AST: 26 U/L (ref 0–37)
Alkaline Phosphatase: 65 U/L (ref 39–117)
Bilirubin, Direct: 0 mg/dL (ref 0.0–0.3)
Total Protein: 7.1 g/dL (ref 6.0–8.3)

## 2011-10-16 LAB — BASIC METABOLIC PANEL
CO2: 26 mEq/L (ref 19–32)
Chloride: 102 mEq/L (ref 96–112)
Creatinine, Ser: 1.1 mg/dL (ref 0.4–1.2)

## 2011-10-31 ENCOUNTER — Other Ambulatory Visit: Payer: Self-pay | Admitting: Internal Medicine

## 2011-12-11 ENCOUNTER — Ambulatory Visit: Payer: Self-pay | Admitting: Internal Medicine

## 2011-12-12 ENCOUNTER — Encounter: Payer: Self-pay | Admitting: Internal Medicine

## 2011-12-17 ENCOUNTER — Encounter: Payer: Self-pay | Admitting: *Deleted

## 2012-10-24 ENCOUNTER — Other Ambulatory Visit: Payer: Self-pay

## 2012-10-24 MED ORDER — TRIAMTERENE-HCTZ 37.5-25 MG PO CAPS: 1.0000 | ORAL_CAPSULE | Freq: Every day | ORAL | Status: DC

## 2012-10-24 NOTE — Telephone Encounter (Signed)
Pt request refill triamterene-HCTZ until CPX 02/10/13. Advised pt refill done to [facility].

## 2013-02-10 ENCOUNTER — Encounter: Payer: Self-pay | Admitting: Internal Medicine

## 2013-02-10 ENCOUNTER — Ambulatory Visit (INDEPENDENT_AMBULATORY_CARE_PROVIDER_SITE_OTHER): Payer: Medicare Other | Admitting: Internal Medicine

## 2013-02-10 VITALS — BP 136/80 | HR 83 | Temp 97.8°F | Ht 67.0 in | Wt 171.0 lb

## 2013-02-10 DIAGNOSIS — I872 Venous insufficiency (chronic) (peripheral): Secondary | ICD-10-CM

## 2013-02-10 DIAGNOSIS — E785 Hyperlipidemia, unspecified: Secondary | ICD-10-CM

## 2013-02-10 DIAGNOSIS — Z0000 Encounter for general adult medical examination without abnormal findings: Secondary | ICD-10-CM

## 2013-02-10 LAB — CBC WITH DIFFERENTIAL/PLATELET
Basophils Relative: 0.7 % (ref 0.0–3.0)
Eosinophils Relative: 1.7 % (ref 0.0–5.0)
MCV: 89.3 fl (ref 78.0–100.0)
Monocytes Absolute: 0.6 10*3/uL (ref 0.1–1.0)
Monocytes Relative: 9.2 % (ref 3.0–12.0)
Neutrophils Relative %: 60.1 % (ref 43.0–77.0)
Platelets: 304 10*3/uL (ref 150.0–400.0)
RBC: 4.66 Mil/uL (ref 3.87–5.11)
WBC: 6.4 10*3/uL (ref 4.5–10.5)

## 2013-02-10 LAB — BASIC METABOLIC PANEL
CO2: 28 mEq/L (ref 19–32)
Chloride: 101 mEq/L (ref 96–112)
Glucose, Bld: 101 mg/dL — ABNORMAL HIGH (ref 70–99)
Potassium: 3.8 mEq/L (ref 3.5–5.1)
Sodium: 138 mEq/L (ref 135–145)

## 2013-02-10 LAB — HEPATIC FUNCTION PANEL
ALT: 15 U/L (ref 0–35)
AST: 21 U/L (ref 0–37)
Alkaline Phosphatase: 59 U/L (ref 39–117)
Bilirubin, Direct: 0.1 mg/dL (ref 0.0–0.3)
Total Bilirubin: 1 mg/dL (ref 0.3–1.2)

## 2013-02-10 LAB — LIPID PANEL: Triglycerides: 271 mg/dL — ABNORMAL HIGH (ref 0.0–149.0)

## 2013-02-10 LAB — TSH: TSH: 2.51 u[IU]/mL (ref 0.35–5.50)

## 2013-02-10 NOTE — Assessment & Plan Note (Signed)
Edema controlled with diuretic and hose Due for labs

## 2013-02-10 NOTE — Assessment & Plan Note (Signed)
Doesn't want meds for primary prevention 

## 2013-02-10 NOTE — Assessment & Plan Note (Signed)
Due for mammo---she will set up Declined zostavax Urged regular exercise---start with walking DASH diet info given

## 2013-02-10 NOTE — Patient Instructions (Addendum)
Please set up your screening mammogram at [facility].  DASH Diet The DASH diet stands for "Dietary Approaches to Stop Hypertension." It is a healthy eating plan that has been shown to reduce high blood pressure (hypertension) in as little as 14 days, while also possibly providing other significant health benefits. These other health benefits include reducing the risk of breast cancer after menopause and reducing the risk of type 2 diabetes, heart disease, colon cancer, and stroke. Health benefits also include weight loss and slowing kidney failure in patients with chronic kidney disease.  DIET GUIDELINES  Limit salt (sodium). Your diet should contain less than 1500 mg of sodium daily.  Limit refined or processed carbohydrates. Your diet should include mostly whole grains. Desserts and added sugars should be used sparingly.  Include small amounts of heart-healthy fats. These types of fats include nuts, oils, and tub margarine. Limit saturated and trans fats. These fats have been shown to be harmful in the body. CHOOSING FOODS  The following food groups are based on a 2000 calorie diet. See your Registered Dietitian for individual calorie needs. Grains and Grain Products (6 to 8 servings daily)  Eat More Often: Whole-wheat bread, brown rice, whole-grain or wheat pasta, quinoa, popcorn without added fat or salt (air popped).  Eat Less Often: White bread, white pasta, white rice, cornbread. Vegetables (4 to 5 servings daily)  Eat More Often: Fresh, frozen, and canned vegetables. Vegetables may be raw, steamed, roasted, or grilled with a minimal amount of fat.  Eat Less Often/Avoid: Creamed or fried vegetables. Vegetables in a cheese sauce. Fruit (4 to 5 servings daily)  Eat More Often: All fresh, canned (in natural juice), or frozen fruits. Dried fruits without added sugar. One hundred percent fruit juice ( cup [237 mL] daily).  Eat Less Often: Dried fruits with added sugar. Canned fruit in  light or heavy syrup. [employer], Fish, and Poultry (2 servings or less daily. One serving is 3 to 4 oz [85-114 g]).  Eat More Often: Ninety percent or leaner ground beef, tenderloin, sirloin. Round cuts of beef, chicken breast, [place] breast. All fish. Grill, bake, or broil your meat. Nothing should be fried.  Eat Less Often/Avoid: Fatty cuts of meat, [place], or chicken leg, thigh, or wing. Fried cuts of meat or fish. Dairy (2 to 3 servings)  Eat More Often: Low-fat or fat-free milk, low-fat plain or light yogurt, reduced-fat or part-skim cheese.  Eat Less Often/Avoid: Milk (whole, 2%).Whole milk yogurt. Full-fat cheeses. Nuts, Seeds, and Legumes (4 to 5 servings per week)  Eat More Often: All without added salt.  Eat Less Often/Avoid: Salted nuts and seeds, canned beans with added salt. Fats and Sweets (limited)  Eat More Often: Vegetable oils, tub margarines without trans fats, sugar-free gelatin. Mayonnaise and salad dressings.  Eat Less Often/Avoid: Coconut oils, palm oils, butter, stick margarine, cream, half and half, cookies, candy, pie. FOR MORE INFORMATION The Dash Diet Eating Plan: www.dashdiet.org Document Released: 10/18/2011 Document Revised: 01/21/2012 Document Reviewed: 10/18/2011 [facility] Patient Information 2013 [place], [place].

## 2013-02-10 NOTE — Progress Notes (Signed)
Subjective:    Patient ID: Sierra Salinas, female    DOB: September 25, 1946, 67 y.o.   MRN: [id number]  HPI Here for physical  Still with long time partner--now 33 Mom now 90--- requires 24/7 sitters. Some stress with this (she is local). Only child  Still sees gynecologist Gets menostar patch and that helps her menopausal symptoms May just switch over to me  Still wears support hose On daily diuretic for edema also  Discussed cholesterol Not interested in meds at this point Discussed lifestyle Eats out a lot  Current Outpatient Prescriptions on File Prior to Visit  Medication Sig Dispense Refill  . calcium citrate-vitamin D 200-200 MG-UNIT TABS Take 1 tablet by mouth daily.        . diphenhydrAMINE (BENADRYL) 25 MG tablet Take 25 mg by mouth at bedtime.        . Melatonin 1 MG TABS Take 1-3 mg by mouth at bedtime as needed.        Marland Kitchen MENOSTAR 14 MCG/24HR Place 1 patch onto the skin once a week.       . Multiple Vitamins-Minerals (CENTRUM SILVER ULTRA WOMENS) TABS Take by mouth daily.        Marland Kitchen triamterene-hydrochlorothiazide (DYAZIDE) 37.5-25 MG per capsule Take 1 each (1 capsule total) by mouth daily.  30 capsule  4   No current facility-administered medications on file prior to visit.    Allergies  Allergen Reactions  . Nitrofurantoin     Past Medical History  Diagnosis Date  . Allergy   . Osteopenia   . Cystitis     recurrent  . 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 Unspecified venous (peripheral) insufficiency   . Hyperlipidemia   . Osteoarthritis of multiple joints     Past Surgical History  Procedure Laterality Date  . Abdominal hysterectomy    . Nasal septum surgery      Family History  Problem Relation Age of Onset  . Arthritis Mother   . Hypertension Mother   . Cancer Maternal Aunt   . Stroke Maternal Grandmother     History   Social History  . Marital Status: Single    Spouse Name: N/A    Number of Children: 2  . Years of Education:  N/A   Occupational History  . personal assistant    Social History Main Topics  . Smoking status: Former Games developer  . Smokeless tobacco: Never Used  . Alcohol Use: Yes     Comment: occassional  . Drug Use: No  . Sexually Active: Not on file   Other Topics Concern  . Not on file   Social History Narrative   Mostly caregives for long term companion   Has living will   Has designated daughter Gaylyn Rong to be health care POA   Would accept resuscitation attempts---no prolonged artificial ventilation   Would accept feeding tube for limited time depending on circumstances   Review of Systems  Constitutional: Positive for unexpected weight change. Negative for fatigue.       Weight down 7#--surprised her Wears seat belt  HENT: Positive for hearing loss. Negative for congestion, rhinorrhea, dental problem and tinnitus.        Regular with dentist  Eyes: Negative for visual disturbance.       No diplopia or unilateral vision loss  Respiratory: Negative for cough, chest tightness and shortness of breath.   Cardiovascular: Positive for leg swelling. Negative for chest pain and palpitations.  Gastrointestinal:  Negative for nausea, vomiting, abdominal pain, constipation and blood in stool.       No heartburn  Endocrine: Positive for heat intolerance.       No change--doesn't like hot weather  Genitourinary: Negative for urgency, frequency, difficulty urinating and dyspareunia.       No sex--no problem (partner unable now)  Musculoskeletal: Positive for arthralgias. Negative for back pain and joint swelling.       Right hip pain going up stairs  Skin: Negative for rash.       No suspicious lesions  Allergic/Immunologic: Negative for environmental allergies and immunocompromised state.  Neurological: Positive for numbness. Negative for dizziness, syncope, weakness, light-headedness and headaches.       Brief numbness in fingers if holding her arm up for a while  Hematological: Negative for  adenopathy. Does not bruise/bleed easily.  Psychiatric/Behavioral: Positive for sleep disturbance. Negative for dysphoric mood. The patient is not nervous/anxious.        Sleeps okay with benedryl-- no AM sedation or other problems       Objective:   Physical Exam  Constitutional: She is oriented to person, place, and time. She appears well-developed and well-nourished. No distress.  HENT:  Head: Normocephalic and atraumatic.  Right Ear: External ear normal.  Left Ear: External ear normal.  Mouth/Throat: Oropharynx is clear and moist. No oropharyngeal exudate.  Eyes: Conjunctivae and EOM are normal. Pupils are equal, round, and reactive to light.  Neck: Normal range of motion. Neck supple. No thyromegaly present.  Cardiovascular: Normal rate, regular rhythm, normal heart sounds and intact distal pulses.  Exam reveals no gallop.   No murmur heard. Pulmonary/Chest: Effort normal and breath sounds normal. No respiratory distress. She has no wheezes. She has no rales.  Abdominal: Soft. There is no tenderness.  Genitourinary:  Breast exam by gyn in January  Musculoskeletal: She exhibits no edema and no tenderness.  Lymphadenopathy:    She has no cervical adenopathy.    She has no axillary adenopathy.  Neurological: She is alert and oriented to person, place, and time.  Skin: No rash noted. No erythema.  Psychiatric: She has a normal mood and affect. Her behavior is normal.          Assessment & Plan:

## 2013-03-09 ENCOUNTER — Ambulatory Visit: Payer: Self-pay | Admitting: Internal Medicine

## 2013-03-10 ENCOUNTER — Encounter: Payer: Self-pay | Admitting: Internal Medicine

## 2013-03-17 ENCOUNTER — Other Ambulatory Visit: Payer: Self-pay | Admitting: Internal Medicine

## 2013-04-21 ENCOUNTER — Other Ambulatory Visit: Payer: Self-pay | Admitting: Internal Medicine

## 2013-09-17 ENCOUNTER — Other Ambulatory Visit: Payer: Self-pay

## 2013-10-27 ENCOUNTER — Encounter: Payer: Self-pay | Admitting: Internal Medicine

## 2013-10-27 ENCOUNTER — Ambulatory Visit (INDEPENDENT_AMBULATORY_CARE_PROVIDER_SITE_OTHER): Payer: Medicare Other | Admitting: Internal Medicine

## 2013-10-27 VITALS — BP 126/70 | HR 74 | Temp 97.8°F | Wt 173.0 lb

## 2013-10-27 DIAGNOSIS — R002 Palpitations: Secondary | ICD-10-CM

## 2013-10-27 LAB — CBC WITH DIFFERENTIAL/PLATELET
Basophils Relative: 0.3 % (ref 0.0–3.0)
Eosinophils Absolute: 0.1 10*3/uL (ref 0.0–0.7)
Eosinophils Relative: 1.4 % (ref 0.0–5.0)
HCT: 43.1 % (ref 36.0–46.0)
Hemoglobin: 14.5 g/dL (ref 12.0–15.0)
Lymphs Abs: 2 10*3/uL (ref 0.7–4.0)
MCHC: 33.7 g/dL (ref 30.0–36.0)
MCV: 90 fl (ref 78.0–100.0)
Monocytes Absolute: 0.7 10*3/uL (ref 0.1–1.0)
Neutro Abs: 4.1 10*3/uL (ref 1.4–7.7)
Neutrophils Relative %: 59.8 % (ref 43.0–77.0)
RBC: 4.78 Mil/uL (ref 3.87–5.11)
WBC: 6.9 10*3/uL (ref 4.5–10.5)

## 2013-10-27 NOTE — Assessment & Plan Note (Signed)
Doesn't sound like SVT EKG is normal BP fine Will not add any meds or do other work up If still having symptoms after the stress of the season is over, would consider further eval  Will just check thyroid function and CBC

## 2013-10-27 NOTE — Patient Instructions (Signed)
Let me know if your heart symptoms persist after the first of the year.

## 2013-10-27 NOTE — Progress Notes (Signed)
Subjective:    Patient ID: Sierra Salinas, female    DOB: 02/01/1946, 67 y.o.   MRN: [id number]  HPI "stress I think"  "anything and everything is happening" Stress during holiday season--"so much extra stuff to do" Daughter not well-- expecting surgery caregiving for mom ---coordinating sitters, etc  Man she takes care of declining---- working on downsizing his living situation  Heartbeat "has been driving me crazy" Sleep has been poor---she feels pounding when in bed Doesn't notice it during the day No skips Fast and pounding--worse when reclining.  Her estimation of rate sounds like no more than 100 Awakens from sleep--- either from stress or from the heart sensation  BP yesterday-- 130/70 but pulse 1001 Stable DOE--like going up steps  Current Outpatient Prescriptions on File Prior to Visit  Medication Sig Dispense Refill  . calcium citrate-vitamin D 200-200 MG-UNIT TABS Take 1 tablet by mouth daily.        . diphenhydrAMINE (BENADRYL) 25 MG tablet Take 25 mg by mouth at bedtime.        . fish oil-omega-3 fatty acids 1000 MG capsule Take 1,200 mg by mouth daily.      . Melatonin 1 MG TABS Take 1-3 mg by mouth at bedtime as needed.        Marland Kitchen MENOSTAR 14 MCG/24HR Place 1 patch onto the skin once a week.       . Multiple Vitamins-Minerals (CENTRUM SILVER ULTRA WOMENS) TABS Take by mouth daily.        . naproxen sodium (ANAPROX) 220 MG tablet Take 220 mg by mouth daily.      Marland Kitchen triamterene-hydrochlorothiazide (DYAZIDE) 37.5-25 MG per capsule TAKE 1 EACH (1 CAPSULE TOTAL) BY MOUTH DAILY.  90 capsule  3   No current facility-administered medications on file prior to visit.    Allergies  Allergen Reactions  . Nitrofurantoin     Past Medical History  Diagnosis Date  . Allergy   . Osteopenia   . Cystitis     recurrent  . 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 Unspecified venous (peripheral) insufficiency   . Hyperlipidemia   . Osteoarthritis of multiple joints      Past Surgical History  Procedure Laterality Date  . Abdominal hysterectomy    . Nasal septum surgery      Family History  Problem Relation Age of Onset  . Arthritis Mother   . Hypertension Mother   . Cancer Maternal Aunt   . Stroke Maternal Grandmother     History   Social History  . Marital Status: Single    Spouse Name: N/A    Number of Children: 2  . Years of Education: N/A   Occupational History  . personal assistant    Social History Main Topics  . Smoking status: Former Games developer  . Smokeless tobacco: Never Used  . Alcohol Use: Yes     Comment: occassional  . Drug Use: No  . Sexual Activity: Not on file   Other Topics Concern  . Not on file   Social History Narrative   Mostly caregives for long term companion   Has living will   Has designated daughter Gaylyn Rong to be health care POA   Would accept resuscitation attempts---no prolonged artificial ventilation   Would accept feeding tube for limited time depending on circumstances   Review of Systems Appetite is fine Weight up 2# No cough or SOB     Objective:   Physical Exam  Constitutional: She appears well-developed and well-nourished. No distress.  Neck: Normal range of motion. Neck supple. No thyromegaly present.  No carotid bruits  Cardiovascular: Normal rate, regular rhythm and normal heart sounds.  Exam reveals no gallop.   No murmur heard. Pulmonary/Chest: Effort normal and breath sounds normal. No respiratory distress. She has no wheezes. She has no rales.  Musculoskeletal: She exhibits no edema and no tenderness.  Lymphadenopathy:    She has no cervical adenopathy.  Psychiatric: She has a normal mood and affect. Her behavior is normal.          Assessment & Plan:

## 2014-02-16 ENCOUNTER — Encounter: Payer: Self-pay | Admitting: Internal Medicine

## 2014-02-16 ENCOUNTER — Ambulatory Visit (INDEPENDENT_AMBULATORY_CARE_PROVIDER_SITE_OTHER): Payer: Medicare Other | Admitting: Internal Medicine

## 2014-02-16 VITALS — BP 118/70 | HR 69 | Temp 98.1°F | Ht 67.0 in | Wt 175.0 lb

## 2014-02-16 DIAGNOSIS — I872 Venous insufficiency (chronic) (peripheral): Secondary | ICD-10-CM

## 2014-02-16 DIAGNOSIS — Z23 Encounter for immunization: Secondary | ICD-10-CM

## 2014-02-16 DIAGNOSIS — E785 Hyperlipidemia, unspecified: Secondary | ICD-10-CM

## 2014-02-16 DIAGNOSIS — Z0000 Encounter for general adult medical examination without abnormal findings: Secondary | ICD-10-CM

## 2014-02-16 LAB — LIPID PANEL
CHOLESTEROL: 283 mg/dL — AB (ref 0–200)
HDL: 43.2 mg/dL (ref >39.00)
LDL Cholesterol: 188 mg/dL — ABNORMAL HIGH (ref 0–99)
TRIGLYCERIDES: 257 mg/dL — AB (ref 0.0–149.0)
Total CHOL/HDL Ratio: 7
VLDL: 51.4 mg/dL — AB (ref 0.0–40.0)

## 2014-02-16 LAB — CBC WITH DIFFERENTIAL/PLATELET
Basophils Absolute: 0 10*3/uL (ref 0.0–0.1)
Basophils Relative: 0.6 % (ref 0.0–3.0)
EOS PCT: 2.1 % (ref 0.0–5.0)
Eosinophils Absolute: 0.1 10*3/uL (ref 0.0–0.7)
HCT: 43.4 % (ref 36.0–46.0)
Hemoglobin: 14.8 g/dL (ref 12.0–15.0)
LYMPHS PCT: 33 % (ref 12.0–46.0)
Lymphs Abs: 2 10*3/uL (ref 0.7–4.0)
MCHC: 34.2 g/dL (ref 30.0–36.0)
MCV: 90.5 fl (ref 78.0–100.0)
Monocytes Absolute: 0.6 10*3/uL (ref 0.1–1.0)
Monocytes Relative: 10.7 % (ref 3.0–12.0)
NEUTROS PCT: 53.6 % (ref 43.0–77.0)
Neutro Abs: 3.2 10*3/uL (ref 1.4–7.7)
Platelets: 272 10*3/uL (ref 150.0–400.0)
RBC: 4.8 Mil/uL (ref 3.87–5.11)
RDW: 13.1 % (ref 11.5–14.6)
WBC: 6 10*3/uL (ref 4.5–10.5)

## 2014-02-16 LAB — COMPREHENSIVE METABOLIC PANEL
ALBUMIN: 3.8 g/dL (ref 3.5–5.2)
ALT: 18 U/L (ref 0–35)
AST: 20 U/L (ref 0–37)
Alkaline Phosphatase: 55 U/L (ref 39–117)
BUN: 23 mg/dL (ref 6–23)
CALCIUM: 9.4 mg/dL (ref 8.4–10.5)
CO2: 29 mEq/L (ref 19–32)
Chloride: 102 mEq/L (ref 96–112)
Creatinine, Ser: 1 mg/dL (ref 0.4–1.2)
GFR: 56.64 mL/min — AB (ref >60.00)
Glucose, Bld: 98 mg/dL (ref 70–99)
Potassium: 4.2 mEq/L (ref 3.5–5.1)
Sodium: 138 mEq/L (ref 135–145)
Total Bilirubin: 0.8 mg/dL (ref 0.3–1.2)
Total Protein: 6.9 g/dL (ref 6.0–8.3)

## 2014-02-16 LAB — TSH: TSH: 3.59 u[IU]/mL (ref 0.35–5.50)

## 2014-02-16 LAB — T4, FREE: FREE T4: 0.89 ng/dL (ref 0.60–1.60)

## 2014-02-16 NOTE — Addendum Note (Signed)
Addended by: Despina Hidden on: 02/16/2014 01:05 PM   Modules accepted: Orders

## 2014-02-16 NOTE — Progress Notes (Signed)
Subjective:    Patient ID: Sierra Salinas, female    DOB: 15-Jul-1946, 68 y.o.   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 Bought smaller home and trying to get him moved and downsized. She needs to drive him around and manage cleaning folks, etc. He does cook for himself.  Still sees gyn-- Dr Imelda Pillow at [facility] internal but no pap  No new concerns  Current Outpatient Prescriptions on File Prior to Visit  Medication Sig Dispense Refill  . calcium citrate-vitamin D 200-200 MG-UNIT TABS Take 1 tablet by mouth daily.        . diphenhydrAMINE (BENADRYL) 25 MG tablet Take 25 mg by mouth at bedtime.        . fish oil-omega-3 fatty acids 1000 MG capsule Take 1,200 mg by mouth daily.      . Melatonin 1 MG TABS Take 1-3 mg by mouth at bedtime as needed.        Marland Kitchen MENOSTAR 14 MCG/24HR Place 1 patch onto the skin once a week.       . Multiple Vitamins-Minerals (CENTRUM SILVER ULTRA WOMENS) TABS Take by mouth daily.        . naproxen sodium (ANAPROX) 220 MG tablet Take 220 mg by mouth daily.      Marland Kitchen triamterene-hydrochlorothiazide (DYAZIDE) 37.5-25 MG per capsule TAKE 1 EACH (1 CAPSULE TOTAL) BY MOUTH DAILY.  90 capsule  3   No current facility-administered medications on file prior to visit.    Allergies  Allergen Reactions  . Nitrofurantoin     Past Medical History  Diagnosis Date  . Allergy   . Osteopenia   . Cystitis     recurrent  . 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 Unspecified venous (peripheral) insufficiency   . Hyperlipidemia   . Osteoarthritis of multiple joints     Past Surgical History  Procedure Laterality Date  . Abdominal hysterectomy    . Nasal septum surgery      Family History  Problem Relation Age of Onset  . Arthritis Mother   . Hypertension Mother   . Cancer Maternal Aunt   .  Stroke Maternal Grandmother     History   Social History  . Marital Status: Single    Spouse Name: N/A    Number of Children: 2  . Years of Education: N/A   Occupational History  . personal assistant    Social History Main Topics  . Smoking status: Former Research scientist (life sciences)  . Smokeless tobacco: Never Used  . Alcohol Use: Yes     Comment: occassional  . Drug Use: No  . Sexual Activity: Not on file   Other Topics Concern  . Not on file   Social History Narrative   Mostly caregives for long term companion   Has living will   Has designated daughter [name] to be health care POA   Would accept resuscitation attempts---no prolonged artificial ventilation   Would accept feeding tube for limited time depending on circumstances   Review of Systems  Constitutional: Negative for fatigue and unexpected weight change.       Wears seat belt No regular exercise  HENT: Negative for dental problem, hearing loss and tinnitus.        Regular with dentist  Eyes: Negative for visual disturbance.  No diplopia or unilateral vision loss  Respiratory: Negative for cough, chest tightness and shortness of breath.   Cardiovascular: Positive for leg swelling. Negative for chest pain and palpitations.       Uses compression hose and diuretic for swelling  Gastrointestinal: Negative for nausea, vomiting, abdominal pain, constipation and blood in stool.  Endocrine: Positive for heat intolerance. Negative for cold intolerance.       Long standing heat intolerance  Genitourinary: Negative for dysuria, hematuria and difficulty urinating.       No incontinence  No sex-no problem  Musculoskeletal: Positive for arthralgias. Negative for back pain and joint swelling.       Occ mild knee pain--like on steps  Skin: Negative for rash.       No suspicious lesions Sees derm regularly  Allergic/Immunologic: Positive for environmental allergies. Negative for immunocompromised state.       Mild symptoms--no meds    Neurological: Negative for dizziness, syncope, weakness, light-headedness, numbness and headaches.  Psychiatric/Behavioral: Positive for sleep disturbance. Negative for dysphoric mood. The patient is not nervous/anxious.        Sleeps fair with benedryl and melatonin       Objective:   Physical Exam  Constitutional: She is oriented to person, place, and time. She appears well-developed and well-nourished. No distress.  HENT:  Head: Normocephalic and atraumatic.  Right Ear: External ear normal.  Left Ear: External ear normal.  Mouth/Throat: Oropharynx is clear and moist. No oropharyngeal exudate.  Eyes: Conjunctivae and EOM are normal. Pupils are equal, round, and reactive to light.  Neck: Normal range of motion. Neck supple. No thyromegaly present.  Cardiovascular: Normal rate, regular rhythm, normal heart sounds and intact distal pulses.  Exam reveals no gallop.   No murmur heard. Pulmonary/Chest: Effort normal and breath sounds normal. No respiratory distress. She has no wheezes. She has no rales.  Abdominal: Soft. There is no tenderness.  Musculoskeletal: She exhibits no edema and no tenderness.  Lymphadenopathy:    She has no cervical adenopathy.  Neurological: She is alert and oriented to person, place, and time.  Skin: No rash noted. No erythema.  Psychiatric: She has a normal mood and affect. Her behavior is normal.          Assessment & Plan:

## 2014-02-16 NOTE — Assessment & Plan Note (Signed)
Does okay with support hose and the diuretic Due for labs

## 2014-02-16 NOTE — Assessment & Plan Note (Signed)
Healthy but needs to work on fitness

## 2014-02-16 NOTE — Assessment & Plan Note (Signed)
Not excited about primary prevention Will consider statin if LDL over 190 though

## 2014-02-16 NOTE — Progress Notes (Signed)
Pre visit review using our clinic review tool, if applicable. No additional management support is needed unless otherwise documented below in the visit note. 

## 2014-02-16 NOTE — Patient Instructions (Signed)
DASH Diet The DASH diet stands for "Dietary Approaches to Stop Hypertension." It is a healthy eating plan that has been shown to reduce high blood pressure (hypertension) in as little as 14 days, while also possibly providing other significant health benefits. These other health benefits include reducing the risk of breast cancer after menopause and reducing the risk of type 2 diabetes, heart disease, colon cancer, and stroke. Health benefits also include weight loss and slowing kidney failure in patients with chronic kidney disease.  DIET GUIDELINES  Limit salt (sodium). Your diet should contain less than 1500 mg of sodium daily.  Limit refined or processed carbohydrates. Your diet should include mostly whole grains. Desserts and added sugars should be used sparingly.  Include small amounts of heart-healthy fats. These types of fats include nuts, oils, and tub margarine. Limit saturated and trans fats. These fats have been shown to be harmful in the body. CHOOSING FOODS  The following food groups are based on a 2000 calorie diet. See your Registered Dietitian for individual calorie needs. Grains and Grain Products (6 to 8 servings daily)  Eat More Often: Whole-wheat bread, brown Wynia, whole-grain or wheat pasta, quinoa, popcorn without added fat or salt (air popped).  Eat Less Often: White bread, white pasta, white Goul[place], cornbread. Vegetables (4 to 5 servings daily)  Eat More Often: Fresh, frozen, and canned vegetables. Vegetables may be raw, steamed, roasted, or grilled with a minimal amount of fat.  Eat Less Often/Avoid: Creamed or fried vegetables. Vegetables in a cheese sauce. Fruit (4 to 5 servings daily)  Eat More Often: All fresh, canned (in natural juice), or frozen fruits. Dried fruits without added sugar. One hundred percent fruit juice ( cup [237 mL] daily).  Eat Less Often: Dried fruits with added sugar. Canned fruit in light or heavy syrup. [employer], Fish, and Poultry (2  servings or less daily. One serving is 3 to 4 oz [85-114 g]).  Eat More Often: Ninety percent or leaner ground beef, tenderloin, sirloin. Round cuts of beef, chicken breast, [place] breast. All fish. Grill, bake, or broil your meat. Nothing should be fried.  Eat Less Often/Avoid: Fatty cuts of meat, [place], or chicken leg, thigh, or wing. Fried cuts of meat or fish. Dairy (2 to 3 servings)  Eat More Often: Low-fat or fat-free milk, low-fat plain or light yogurt, reduced-fat or part-skim cheese.  Eat Less Often/Avoid: Milk (whole, 2%).Whole milk yogurt. Full-fat cheeses. Nuts, Seeds, and Legumes (4 to 5 servings per week)  Eat More Often: All without added salt.  Eat Less Often/Avoid: Salted nuts and seeds, canned beans with added salt. Fats and Sweets (limited)  Eat More Often: Vegetable oils, tub margarines without trans fats, sugar-free gelatin. Mayonnaise and salad dressings.  Eat Less Often/Avoid: Coconut oils, palm oils, butter, stick margarine, cream, half and half, cookies, candy, pie. FOR MORE INFORMATION The Dash Diet Eating Plan: www.dashdiet.org Document Released: 10/18/2011 Document Revised: 01/21/2012 Document Reviewed: 10/18/2011 [facility] Patient Information 2014 [place], [place]. Exercise to Lose Weight Exercise and a healthy diet may help you lose weight. Your doctor may suggest specific exercises. EXERCISE IDEAS AND TIPS  Choose low-cost things you enjoy doing, such as walking, bicycling, or exercising to workout videos.  Take stairs instead of the elevator.  Walk during your lunch break.  Park your car further away from work or school.  Go to a gym or an exercise class.  Start with 5 to 10 minutes of exercise each day. Build up to 30 minutes  of exercise 4 to 6 days a week.  Wear shoes with good support and comfortable clothes.  Stretch before and after working out.  Work out until you breathe harder and your heart beats faster.  Drink extra water when  you exercise.  Do not do so much that you hurt yourself, feel dizzy, or get very short of breath. Exercises that burn about 150 calories:  Running 1  miles in 15 minutes.  Playing volleyball for 45 to 60 minutes.  Washing and waxing a car for 45 to 60 minutes.  Playing touch football for 45 minutes.  Walking 1  miles in 35 minutes.  Pushing a stroller 1  miles in 30 minutes.  Playing basketball for 30 minutes.  Raking leaves for 30 minutes.  Bicycling 5 miles in 30 minutes.  Walking 2 miles in 30 minutes.  Dancing for 30 minutes.  Shoveling snow for 15 minutes.  Swimming laps for 20 minutes.  Walking up stairs for 15 minutes.  Bicycling 4 miles in 15 minutes.  Gardening for 30 to 45 minutes.  Jumping rope for 15 minutes.  Washing windows or floors for 45 to 60 minutes. Document Released: 12/01/2010 Document Revised: 01/21/2012 Document Reviewed: 12/01/2010 [facility] Patient Information 2014 [place], [place].

## 2014-02-24 ENCOUNTER — Encounter: Payer: Self-pay | Admitting: Internal Medicine

## 2014-03-17 ENCOUNTER — Other Ambulatory Visit: Payer: Self-pay | Admitting: Internal Medicine

## 2014-03-29 ENCOUNTER — Ambulatory Visit: Payer: Self-pay | Admitting: Internal Medicine

## 2014-03-29 ENCOUNTER — Encounter: Payer: Self-pay | Admitting: Internal Medicine

## 2015-01-11 ENCOUNTER — Ambulatory Visit (INDEPENDENT_AMBULATORY_CARE_PROVIDER_SITE_OTHER): Payer: Medicare Other

## 2015-01-11 ENCOUNTER — Encounter: Payer: Self-pay | Admitting: Podiatry

## 2015-01-11 ENCOUNTER — Ambulatory Visit (INDEPENDENT_AMBULATORY_CARE_PROVIDER_SITE_OTHER): Payer: Medicare Other | Admitting: Podiatry

## 2015-01-11 VITALS — BP 152/90 | HR 88 | Resp 14 | Ht 67.0 in | Wt 170.0 lb

## 2015-01-11 DIAGNOSIS — M722 Plantar fascial fibromatosis: Secondary | ICD-10-CM

## 2015-01-11 NOTE — Progress Notes (Signed)
   Subjective:    Patient ID: Sierra Salinas, female    DOB: 04/18/1946, 69 y.o.   MRN: [id number]  HPI Comments: Pt complains of a sharp pain through out the left foot especially the left heel for 6 months.  Foot Pain      Review of Systems  All other systems reviewed and are negative.      Objective:   Physical Exam: I have reviewed her past mental history medications allergies surgery social history and review of systems. Pulses are strongly palpable. Neurologic sensorium is intact muscles are intact deep tendon reflexes are intact bilateral. She has pain on palpation medial calcaneal tubercle on orthopedic exam. Otherwise all joints move with full range of motion distal to the ankle.        Assessment & Plan:  Assessment: Plantar fasciitis left heel.  Plan: Injected her left heel today and she was scanned for a new set of orthotics. I will follow up with her once her orthotics arrive. She will continue her naproxen.

## 2015-02-01 ENCOUNTER — Encounter: Payer: Self-pay | Admitting: Podiatry

## 2015-02-01 ENCOUNTER — Ambulatory Visit (INDEPENDENT_AMBULATORY_CARE_PROVIDER_SITE_OTHER): Payer: Medicare Other | Admitting: Podiatry

## 2015-02-01 DIAGNOSIS — M722 Plantar fascial fibromatosis: Secondary | ICD-10-CM | POA: Diagnosis not present

## 2015-02-01 NOTE — Progress Notes (Signed)
She presents today for follow-up of her painful plantar fasciitis left foot.  Objective: Vital signs are stable she's alert and oriented 3 she has pain on palpation medial continued tubercle of the left heel. Pulses remain palpable.  Assessment: Chronic intractable plantar fasciitis left foot.  Plan: Discussed appropriate shoe gear stretching exercises ice therapy shoe modifications and dispensed new his orthotics. We did discuss the possible need for physical therapy which she declined. Also discussed injection therapy particularly dehydrated alcohol which she declined. We did discuss the possible need for surgery which she declined. I will follow up with her as needed.

## 2015-02-18 ENCOUNTER — Encounter: Payer: Self-pay | Admitting: Internal Medicine

## 2015-02-18 ENCOUNTER — Ambulatory Visit (INDEPENDENT_AMBULATORY_CARE_PROVIDER_SITE_OTHER): Payer: Medicare Other | Admitting: Internal Medicine

## 2015-02-18 VITALS — BP 120/80 | HR 70 | Temp 98.0°F | Ht 67.0 in | Wt 179.0 lb

## 2015-02-18 DIAGNOSIS — N951 Menopausal and female climacteric states: Secondary | ICD-10-CM | POA: Diagnosis not present

## 2015-02-18 DIAGNOSIS — E785 Hyperlipidemia, unspecified: Secondary | ICD-10-CM | POA: Diagnosis not present

## 2015-02-18 DIAGNOSIS — Z0000 Encounter for general adult medical examination without abnormal findings: Secondary | ICD-10-CM

## 2015-02-18 DIAGNOSIS — I872 Venous insufficiency (chronic) (peripheral): Secondary | ICD-10-CM

## 2015-02-18 DIAGNOSIS — Z7189 Other specified counseling: Secondary | ICD-10-CM

## 2015-02-18 DIAGNOSIS — Z1211 Encounter for screening for malignant neoplasm of colon: Secondary | ICD-10-CM

## 2015-02-18 LAB — CBC WITH DIFFERENTIAL/PLATELET
Basophils Absolute: 0 10*3/uL (ref 0.0–0.1)
Basophils Relative: 0.8 % (ref 0.0–3.0)
EOS ABS: 0.1 10*3/uL (ref 0.0–0.7)
Eosinophils Relative: 2.1 % (ref 0.0–5.0)
HEMATOCRIT: 46.2 % — AB (ref 36.0–46.0)
Hemoglobin: 15.8 g/dL — ABNORMAL HIGH (ref 12.0–15.0)
LYMPHS ABS: 2 10*3/uL (ref 0.7–4.0)
LYMPHS PCT: 31.7 % (ref 12.0–46.0)
MCHC: 34.2 g/dL (ref 30.0–36.0)
MCV: 88.8 fl (ref 78.0–100.0)
Monocytes Absolute: 0.7 10*3/uL (ref 0.1–1.0)
Monocytes Relative: 10.9 % (ref 3.0–12.0)
NEUTROS ABS: 3.4 10*3/uL (ref 1.4–7.7)
Neutrophils Relative %: 54.5 % (ref 43.0–77.0)
PLATELETS: 283 10*3/uL (ref 150.0–400.0)
RBC: 5.21 Mil/uL — ABNORMAL HIGH (ref 3.87–5.11)
RDW: 13.4 % (ref 11.5–15.5)
WBC: 6.3 10*3/uL (ref 4.0–10.5)

## 2015-02-18 LAB — LDL CHOLESTEROL, DIRECT: Direct LDL: 172 mg/dL

## 2015-02-18 LAB — LIPID PANEL
CHOL/HDL RATIO: 6
CHOLESTEROL: 284 mg/dL — AB (ref 0–200)
HDL: 45.8 mg/dL (ref >39.00)
NonHDL: 238.2
Triglycerides: 269 mg/dL — ABNORMAL HIGH (ref 0.0–149.0)
VLDL: 53.8 mg/dL — ABNORMAL HIGH (ref 0.0–40.0)

## 2015-02-18 LAB — COMPREHENSIVE METABOLIC PANEL
ALK PHOS: 62 U/L (ref 39–117)
ALT: 14 U/L (ref 0–35)
AST: 19 U/L (ref 0–37)
Albumin: 4 g/dL (ref 3.5–5.2)
BUN: 25 mg/dL — ABNORMAL HIGH (ref 6–23)
CO2: 28 mEq/L (ref 19–32)
CREATININE: 1.03 mg/dL (ref 0.40–1.20)
Calcium: 9.8 mg/dL (ref 8.4–10.5)
Chloride: 103 mEq/L (ref 96–112)
GFR: 56.47 mL/min — AB (ref >60.00)
Glucose, Bld: 100 mg/dL — ABNORMAL HIGH (ref 70–99)
Potassium: 4.2 mEq/L (ref 3.5–5.1)
SODIUM: 138 meq/L (ref 135–145)
TOTAL PROTEIN: 7.4 g/dL (ref 6.0–8.3)
Total Bilirubin: 0.7 mg/dL (ref 0.2–1.2)

## 2015-02-18 LAB — T4, FREE: Free T4: 0.92 ng/dL (ref 0.60–1.60)

## 2015-02-18 NOTE — Progress Notes (Signed)
Subjective:    Patient ID: Sierra Salinas, female    DOB: 1946/06/14, 69 y.o.   MRN: [id number]  HPI Here for Medicare wellness visit and follow up of chronic medical problems Reviewed form and advanced directives Reviewed her other physicians Not doing exercise--- discussed this No tobacco products Rare alcohol Vision--corrected--is good Mild hearing loss. Independent with instrumental ADLs No falls or anhedonia No apparent cognitive changes  Having ongoing foot pain Trouble even staying up on her feet New $400 inserts Icing feet and injections---but nothing really helping Goes back probably 6 months (but first had plantar fasciitis in 02/24/99 or so)  Mom died ~2 months ago A blessing really Partner doing okay-- he did move (downsized) so easier to manage  Still sees gyn She prescribes the patch Is on lowest patch dose---discussed spreading out for some estrogen free time Gyn did write for daily oral estradiol---I recommended she change to this so she can try to wean down  Still gets some ankle edema despite compression hose Takes the diuretic daily No SOB No CP No dizziness or syncope No leg aching  Discussed her cholesterol Definitely does not want statin  Current Outpatient Prescriptions on File Prior to Visit  Medication Sig Dispense Refill  . calcium citrate-vitamin D 200-200 MG-UNIT TABS Take 1 tablet by mouth daily.      . diphenhydrAMINE (BENADRYL) 25 MG tablet Take 25 mg by mouth at bedtime.      . fish oil-omega-3 fatty acids 1000 MG capsule Take 1,200 mg by mouth daily.    . Melatonin 1 MG TABS Take 1-3 mg by mouth at bedtime as needed.      Marland Kitchen MENOSTAR 14 MCG/24HR Place 1 patch onto the skin once a week.     . Multiple Vitamins-Minerals (CENTRUM SILVER ULTRA WOMENS) TABS Take by mouth daily.      . naproxen sodium (ANAPROX) 220 MG tablet Take 220 mg by mouth daily.    Marland Kitchen triamterene-hydrochlorothiazide (DYAZIDE) 37.5-25 MG per capsule TAKE 1 EACH (1  CAPSULE TOTAL) BY MOUTH DAILY. 90 capsule 3   No current facility-administered medications on file prior to visit.    Allergies  Allergen Reactions  . Nitrofurantoin     Past Medical History  Diagnosis Date  . Allergy   . Osteopenia   . Cystitis     recurrent  . 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 Unspecified venous (peripheral) insufficiency   . Hyperlipidemia   . Osteoarthritis of multiple joints     Past Surgical History  Procedure Laterality Date  . Abdominal hysterectomy    . Nasal septum surgery      Family History  Problem Relation Age of Onset  . Arthritis Mother   . Hypertension Mother   . Cancer Maternal Aunt   . Stroke Maternal Grandmother     History   Social History  . Marital Status: Single    Spouse Name: N/A  . Number of Children: 2  . Years of Education: N/A   Occupational History  . personal assistant    Social History Main Topics  . Smoking status: Former Research scientist (life sciences)  . Smokeless tobacco: Never Used  . Alcohol Use: Yes     Comment: occassional  . Drug Use: No  . Sexual Activity: Not on file   Other Topics Concern  . Not on file   Social History Narrative   Mostly caregives for long term companion   Has living will  Has designated daughter [name] to be health care POA   Would accept resuscitation attempts---no prolonged artificial ventilation   Would accept feeding tube for limited time depending on circumstances   Review of Systems Getting more "barnacles" especially along bra line Weight is stable Appetite is okay Bowels are fine No problems with arthritis Sleeps okay with benedryl-- no apparent side effects. Melatonin not really effective Regular with dentist---teeth okay Wears seat belt    Objective:   Physical Exam  Constitutional: She is oriented to person, place, and time. She appears well-developed and well-nourished. No distress.  HENT:  Mouth/Throat: Oropharynx is clear and moist. No oropharyngeal exudate.    Neck: Normal range of motion. Neck supple. No thyromegaly present.  Cardiovascular: Normal rate, regular rhythm, normal heart sounds and intact distal pulses.  Exam reveals no gallop.   No murmur heard. Pulmonary/Chest: Effort normal and breath sounds normal. No respiratory distress. She has no wheezes. She has no rales.  Abdominal: Soft. There is no tenderness.  Musculoskeletal: She exhibits no tenderness.  Trace ankle edema  Lymphadenopathy:    She has no cervical adenopathy.  Neurological: She is alert and oriented to person, place, and time.  President-- "Obama, Bush, Clinton" [phone] [id number] Recall 2/3  Skin: No rash noted. No erythema.  Psychiatric: She has a normal mood and affect. Her behavior is normal.          Assessment & Plan:

## 2015-02-18 NOTE — Progress Notes (Signed)
Pre visit review using our clinic review tool, if applicable. No additional management support is needed unless otherwise documented below in the visit note. 

## 2015-02-18 NOTE — Assessment & Plan Note (Signed)
Discussed trying to wean the estrogen to the lowest effective dose--easier on the oral therapy so she is considering changing

## 2015-02-18 NOTE — Patient Instructions (Signed)
DASH Eating Plan °DASH stands for "Dietary Approaches to Stop Hypertension." The DASH eating plan is a healthy eating plan that has been shown to reduce high blood pressure (hypertension). Additional health benefits may include reducing the risk of type 2 diabetes mellitus, heart disease, and stroke. The DASH eating plan may also help with weight loss. °WHAT DO I NEED TO KNOW ABOUT THE DASH EATING PLAN? °For the DASH eating plan, you will follow these general guidelines: °· Choose foods with a percent daily value for sodium of less than 5% (as listed on the food label). °· Use salt-free seasonings or herbs instead of table salt or sea salt. °· Check with your health care provider or pharmacist before using salt substitutes. °· Eat lower-sodium products, often labeled as "lower sodium" or "no salt added." °· Eat fresh foods. °· Eat more vegetables, fruits, and low-fat dairy products. °· Choose whole grains. Look for the word "whole" as the first word in the ingredient list. °· Choose fish and skinless chicken or turkey more often than red meat. Limit fish, poultry, and meat to 6 oz (170 g) each day. °· Limit sweets, desserts, sugars, and sugary drinks. °· Choose heart-healthy fats. °· Limit cheese to 1 oz (28 g) per day. °· Eat more home-cooked food and less restaurant, buffet, and fast food. °· Limit fried foods. °· Cook foods using methods other than frying. °· Limit canned vegetables. If you do use them, rinse them well to decrease the sodium. °· When eating at a restaurant, ask that your food be prepared with less salt, or no salt if possible. °WHAT FOODS CAN I EAT? °Seek help from a dietitian for individual calorie needs. °Grains °Whole grain or whole wheat bread. Brown rice. Whole grain or whole wheat pasta. Quinoa, bulgur, and whole grain cereals. Low-sodium cereals. Corn or whole wheat flour tortillas. Whole grain cornbread. Whole grain crackers. Low-sodium crackers. °Vegetables °Fresh or frozen vegetables  (raw, steamed, roasted, or grilled). Low-sodium or reduced-sodium tomato and vegetable juices. Low-sodium or reduced-sodium tomato sauce and paste. Low-sodium or reduced-sodium canned vegetables.  °Fruits °All fresh, canned (in natural juice), or frozen fruits. °Meat and Other Protein Products °Ground beef (85% or leaner), grass-fed beef, or beef trimmed of fat. Skinless chicken or turkey. Ground chicken or turkey. Pork trimmed of fat. All fish and seafood. Eggs. Dried beans, peas, or lentils. Unsalted nuts and seeds. Unsalted canned beans. °Dairy °Low-fat dairy products, such as skim or 1% milk, 2% or reduced-fat cheeses, low-fat ricotta or cottage cheese, or plain low-fat yogurt. Low-sodium or reduced-sodium cheeses. °Fats and Oils °Tub margarines without trans fats. Light or reduced-fat mayonnaise and salad dressings (reduced sodium). Avocado. Safflower, olive, or canola oils. Natural peanut or almond butter. °Other °Unsalted popcorn and pretzels. °The items listed above may not be a complete list of recommended foods or beverages. Contact your dietitian for more options. °WHAT FOODS ARE NOT RECOMMENDED? °Grains °White bread. White pasta. White rice. Refined cornbread. Bagels and croissants. Crackers that contain trans fat. °Vegetables °Creamed or fried vegetables. Vegetables in a cheese sauce. Regular canned vegetables. Regular canned tomato sauce and paste. Regular tomato and vegetable juices. °Fruits °Dried fruits. Canned fruit in light or heavy syrup. Fruit juice. °Meat and Other Protein Products °Fatty cuts of meat. Ribs, chicken wings, bacon, sausage, bologna, salami, chitterlings, fatback, hot dogs, bratwurst, and packaged luncheon meats. Salted nuts and seeds. Canned beans with salt. °Dairy °Whole or 2% milk, cream, half-and-half, and cream cheese. Whole-fat or sweetened yogurt. Full-fat   cheeses or blue cheese. Nondairy creamers and whipped toppings. Processed cheese, cheese spreads, or cheese  curds. °Condiments °Onion and garlic salt, seasoned salt, table salt, and sea salt. Canned and packaged gravies. Worcestershire sauce. Tartar sauce. Barbecue sauce. Teriyaki sauce. Soy sauce, including reduced sodium. Steak sauce. Fish sauce. Oyster sauce. Cocktail sauce. Horseradish. Ketchup and mustard. Meat flavorings and tenderizers. Bouillon cubes. Hot sauce. Tabasco sauce. Marinades. Taco seasonings. Relishes. °Fats and Oils °Butter, stick margarine, lard, shortening, ghee, and bacon fat. Coconut, palm kernel, or palm oils. Regular salad dressings. °Other °Pickles and olives. Salted popcorn and pretzels. °The items listed above may not be a complete list of foods and beverages to avoid. Contact your dietitian for more information. °WHERE CAN I FIND MORE INFORMATION? °National Heart, Lung, and Blood Institute: www.nhlbi.nih.gov/health/health-topics/topics/dash/ °Document Released: 10/18/2011 Document Revised: 03/15/2014 Document Reviewed: 09/02/2013 °ExitCare® Patient Information ©2015 [employer]. This information is not intended to replace advice given to you by your health care provider. Make sure you discuss any questions you have with your health care provider. ° °

## 2015-02-18 NOTE — Assessment & Plan Note (Signed)
I have personally reviewed the Medicare Annual Wellness questionnaire and have noted 1. The patient's medical and social history 2. Their use of alcohol, tobacco or illicit drugs 3. Their current medications and supplements 4. The patient's functional ability including ADL's, fall risks, home safety risks and hearing or visual             impairment. 5. Diet and physical activities 6. Evidence for depression or mood disorders  The patients weight, height, BMI and visual acuity have been recorded in the chart I have made referrals, counseling and provided education to the patient based review of the above and I have provided the pt with a written personalized care plan for preventive services.  I have provided you with a copy of your personalized plan for preventive services. Please take the time to review along with your updated medication list.  Due for colonoscopy No Pap due to age UTD on mammogram No zostavax since no immunity to varicella Discussed fitness

## 2015-02-18 NOTE — Assessment & Plan Note (Signed)
See social history 

## 2015-02-18 NOTE — Assessment & Plan Note (Signed)
Fair control with the compression hose and diuretic

## 2015-02-18 NOTE — Assessment & Plan Note (Signed)
She does not want statin for primary prevention

## 2015-03-04 ENCOUNTER — Other Ambulatory Visit: Payer: Self-pay | Admitting: Internal Medicine

## 2015-03-08 ENCOUNTER — Other Ambulatory Visit: Payer: Self-pay

## 2015-03-08 DIAGNOSIS — Z1231 Encounter for screening mammogram for malignant neoplasm of breast: Secondary | ICD-10-CM

## 2015-04-04 ENCOUNTER — Ambulatory Visit
Admission: RE | Admit: 2015-04-04 | Discharge: 2015-04-04 | Disposition: A | Discharge status: Home / Self Care | Payer: Medicare Other | Source: Ambulatory Visit | Attending: Internal Medicine | Admitting: Internal Medicine

## 2015-04-04 ENCOUNTER — Other Ambulatory Visit: Payer: Self-pay

## 2015-04-04 DIAGNOSIS — Z1231 Encounter for screening mammogram for malignant neoplasm of breast: Secondary | ICD-10-CM

## 2015-04-27 ENCOUNTER — Ambulatory Visit (INDEPENDENT_AMBULATORY_CARE_PROVIDER_SITE_OTHER): Payer: Medicare Other | Admitting: Internal Medicine

## 2015-04-27 ENCOUNTER — Encounter: Payer: Self-pay | Admitting: Internal Medicine

## 2015-04-27 VITALS — BP 132/68 | HR 86 | Temp 97.8°F | Wt 180.0 lb

## 2015-04-27 DIAGNOSIS — R300 Dysuria: Secondary | ICD-10-CM | POA: Diagnosis not present

## 2015-04-27 DIAGNOSIS — N3000 Acute cystitis without hematuria: Secondary | ICD-10-CM

## 2015-04-27 LAB — POCT URINALYSIS DIPSTICK
Bilirubin, UA: NEGATIVE
Glucose, UA: NEGATIVE
Ketones, UA: NEGATIVE
Nitrite, UA: NEGATIVE
Spec Grav, UA: 1.015
UROBILINOGEN UA: NEGATIVE
pH, UA: 7.5

## 2015-04-27 MED ORDER — SULFAMETHOXAZOLE-TRIMETHOPRIM 800-160 MG PO TABS: 1.0000 | ORAL_TABLET | Freq: Two times a day (BID) | ORAL | Status: DC

## 2015-04-27 NOTE — Patient Instructions (Signed)
Please start the antibiotic. If your symptoms are gone after 1-2 doses, you can stop after 3 days. Otherwise take it for the full 10 days.

## 2015-04-27 NOTE — Progress Notes (Signed)
   Subjective:    Patient ID: Sierra Salinas, female    DOB: December 09, 1945, 69 y.o.   MRN: [id number]  HPI Here due to urinary symptoms  Has had some pain with urination Some frequency and urgency Started 3 days ago No hematuria  Last UTI was many years ago--took a long time to clear (had bad gross hematuria) Hasn't tried anything for this  Current Outpatient Prescriptions on File Prior to Visit  Medication Sig Dispense Refill  . calcium citrate-vitamin D 200-200 MG-UNIT TABS Take 1 tablet by mouth daily.      . diphenhydrAMINE (BENADRYL) 25 MG tablet Take 25 mg by mouth at bedtime.      . fish oil-omega-3 fatty acids 1000 MG capsule Take 1,200 mg by mouth daily.    . Melatonin 1 MG TABS Take 1-3 mg by mouth at bedtime as needed.      Marland Kitchen MENOSTAR 14 MCG/24HR Place 1 patch onto the skin once a week.     . Multiple Vitamins-Minerals (CENTRUM SILVER ULTRA WOMENS) TABS Take by mouth daily.      . naproxen sodium (ANAPROX) 220 MG tablet Take 220 mg by mouth daily.    Marland Kitchen triamterene-hydrochlorothiazide (DYAZIDE) 37.5-25 MG per capsule TAKE 1 EACH (1 CAPSULE TOTAL) BY MOUTH DAILY. 90 capsule 3   No current facility-administered medications on file prior to visit.    Allergies  Allergen Reactions  . Nitrofurantoin     Past Medical History  Diagnosis Date  . Allergy   . Osteopenia   . Cystitis     recurrent  . 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 Unspecified venous (peripheral) insufficiency   . Hyperlipidemia   . Osteoarthritis of multiple joints     Past Surgical History  Procedure Laterality Date  . Abdominal hysterectomy    . Nasal septum surgery      Family History  Problem Relation Age of Onset  . Arthritis Mother   . Hypertension Mother   . Ovarian cancer Maternal Aunt 80  . Stroke Maternal Grandmother   . Colon cancer Maternal Aunt 67    History   Social History  . Marital Status: Single    Spouse Name: N/A  . Number of Children: 2  . Years of  Education: N/A   Occupational History  . personal assistant    Social History Main Topics  . Smoking status: Former Research scientist (life sciences)  . Smokeless tobacco: Never Used  . Alcohol Use: Yes     Comment: occassional  . Drug Use: No  . Sexual Activity: Not on file   Other Topics Concern  . Not on file   Social History Narrative   Mostly caregives for long term companion   Has living will   Has designated daughter [name] to be health care POA   Would accept resuscitation attempts---no prolonged artificial ventilation   Would accept feeding tube for limited time depending on circumstances   Review of Systems No fever No abdominal pain No nausea or vomiting No back pain    Objective:   Physical Exam  Constitutional: She appears well-developed and well-nourished. No distress.  Abdominal: Soft. She exhibits no distension. There is no tenderness. There is no rebound and no guarding.  Musculoskeletal:  No CVA tenderness          Assessment & Plan:

## 2015-04-27 NOTE — Assessment & Plan Note (Signed)
No systemic symptoms Will try septra--may only need 3 days

## 2015-04-27 NOTE — Progress Notes (Signed)
Pre visit review using our clinic review tool, if applicable. No additional management support is needed unless otherwise documented below in the visit note. 

## 2015-07-05 ENCOUNTER — Encounter: Payer: Self-pay | Admitting: Gastroenterology

## 2015-07-28 ENCOUNTER — Ambulatory Visit (INDEPENDENT_AMBULATORY_CARE_PROVIDER_SITE_OTHER): Payer: Medicare Other

## 2015-07-28 ENCOUNTER — Encounter: Payer: Self-pay | Admitting: Podiatry

## 2015-07-28 ENCOUNTER — Ambulatory Visit (INDEPENDENT_AMBULATORY_CARE_PROVIDER_SITE_OTHER): Payer: Medicare Other | Admitting: Podiatry

## 2015-07-28 VITALS — BP 132/86 | HR 69 | Resp 12

## 2015-07-28 DIAGNOSIS — M79672 Pain in left foot: Secondary | ICD-10-CM | POA: Diagnosis not present

## 2015-07-28 DIAGNOSIS — G5762 Lesion of plantar nerve, left lower limb: Secondary | ICD-10-CM

## 2015-07-28 DIAGNOSIS — G5782 Other specified mononeuropathies of left lower limb: Secondary | ICD-10-CM

## 2015-07-28 NOTE — Progress Notes (Signed)
She presents today for follow-up of her plantar fasciitis of the left foot which she states is doing a little better. She states that however just recently she noticed that the sharp shooting pain as she extends her metatarsophalangeal joints while standing during toe off. She states it is very hard to reproduce the pain in that she is standing on it. She states that it is simply across the metatarsophalangeal joints. She also states that it radiates up to her knee occasionally. She denies any calf pain. She denies changes to her past medical history medications allergies surgeries and social history.  Objective: Vital signs are stable she is alert and oriented 3 pulses are strongly palpable. Neurologic and Sorum is intact. Deep tendon reflexes are intact. Muscle strength +5 over 5 dorsiflexion plantar flexors and inverters everters all intrinsic musculature is intact. Orthopedic evaluation of a straight awl joints distal to the ankle for range of motion without crepitation. Mild edema to the bilateral foot pain on palpation in either metatarsals. She does have pain on palpation though minimal to the third interdigital space of the left foot. I was unable to reproduce any of her symptoms on evaluation. Radiographs do not demonstrate any type of osseus abnormalities which were taken today in the office 3 views of the left foot. Cutaneous evaluation of a straight mild edema no erythema cellulitis drainage or odor no foreign objects noticed in the skin no signs of infection open wounds or lesions.  Assessment: Neuroma third interdigital space left foot. Mild edema bilateral foot particularly on the left. She also has resolving plantar fasciitis left.  Plan: I injected Kenalog and local anesthesia to the third interdigital space of the left foot today I encouraged her to wear appropriate shoe gear consisting of a wide shoe with a deep toe box. I encouraged her not to go barefoot or to wear flip flops or sandals.  I will follow-up with her in 1 month and reevaluate our injection.  Roselind Messier DPM

## 2015-08-25 ENCOUNTER — Ambulatory Visit (INDEPENDENT_AMBULATORY_CARE_PROVIDER_SITE_OTHER): Payer: Medicare Other | Admitting: Podiatry

## 2015-08-25 ENCOUNTER — Encounter: Payer: Self-pay | Admitting: Podiatry

## 2015-08-25 VITALS — BP 125/75 | HR 96 | Resp 16

## 2015-08-25 DIAGNOSIS — G5782 Other specified mononeuropathies of left lower limb: Secondary | ICD-10-CM

## 2015-08-25 DIAGNOSIS — G5762 Lesion of plantar nerve, left lower limb: Secondary | ICD-10-CM | POA: Diagnosis not present

## 2015-08-28 NOTE — Progress Notes (Signed)
She presents today for follow-up of her neuroma third interdigital space of the left foot. She states that it is better but it is not well yet.  Objective: Vital signs are stable she is alert and oriented 3. Palpable neuroma third interdigital space left with a palpable Mulder's click.  Assessment: Neuroma third interdigital space left.  Plan: Discussed etiology pathology conservative versus surgical therapies. I injected her first dose of dehydrated alcohol to the area today. I will follow-up with her in 3 weeks.  Roselind Messier DPM

## 2015-09-09 ENCOUNTER — Ambulatory Visit (INDEPENDENT_AMBULATORY_CARE_PROVIDER_SITE_OTHER): Payer: Medicare Other

## 2015-09-09 DIAGNOSIS — Z23 Encounter for immunization: Secondary | ICD-10-CM

## 2015-09-15 ENCOUNTER — Ambulatory Visit: Payer: Medicare Other | Admitting: Podiatry

## 2016-02-20 ENCOUNTER — Encounter: Payer: Self-pay | Admitting: Gastroenterology

## 2016-02-22 ENCOUNTER — Encounter: Payer: Self-pay | Admitting: Internal Medicine

## 2016-02-22 ENCOUNTER — Ambulatory Visit (INDEPENDENT_AMBULATORY_CARE_PROVIDER_SITE_OTHER): Payer: Medicare Other | Admitting: Internal Medicine

## 2016-02-22 VITALS — BP 122/88 | HR 76 | Temp 97.6°F | Ht 66.75 in | Wt 181.5 lb

## 2016-02-22 DIAGNOSIS — Z0000 Encounter for general adult medical examination without abnormal findings: Secondary | ICD-10-CM

## 2016-02-22 DIAGNOSIS — M8949 Other hypertrophic osteoarthropathy, multiple sites: Secondary | ICD-10-CM

## 2016-02-22 DIAGNOSIS — E785 Hyperlipidemia, unspecified: Secondary | ICD-10-CM

## 2016-02-22 DIAGNOSIS — M150 Primary generalized (osteo)arthritis: Secondary | ICD-10-CM | POA: Diagnosis not present

## 2016-02-22 DIAGNOSIS — Z7189 Other specified counseling: Secondary | ICD-10-CM

## 2016-02-22 DIAGNOSIS — N951 Menopausal and female climacteric states: Secondary | ICD-10-CM | POA: Diagnosis not present

## 2016-02-22 DIAGNOSIS — M159 Polyosteoarthritis, unspecified: Secondary | ICD-10-CM

## 2016-02-22 DIAGNOSIS — I872 Venous insufficiency (chronic) (peripheral): Secondary | ICD-10-CM | POA: Diagnosis not present

## 2016-02-22 LAB — CBC WITH DIFFERENTIAL/PLATELET
BASOS ABS: 0 10*3/uL (ref 0.0–0.1)
Basophils Relative: 0.8 % (ref 0.0–3.0)
EOS ABS: 0.2 10*3/uL (ref 0.0–0.7)
Eosinophils Relative: 2.6 % (ref 0.0–5.0)
HEMATOCRIT: 42.8 % (ref 36.0–46.0)
Hemoglobin: 14.6 g/dL (ref 12.0–15.0)
LYMPHS ABS: 2.1 10*3/uL (ref 0.7–4.0)
LYMPHS PCT: 33.2 % (ref 12.0–46.0)
MCHC: 34 g/dL (ref 30.0–36.0)
MCV: 89.5 fl (ref 78.0–100.0)
MONOS PCT: 10.4 % (ref 3.0–12.0)
Monocytes Absolute: 0.7 10*3/uL (ref 0.1–1.0)
NEUTROS PCT: 53 % (ref 43.0–77.0)
Neutro Abs: 3.4 10*3/uL (ref 1.4–7.7)
PLATELETS: 284 10*3/uL (ref 150.0–400.0)
RBC: 4.78 Mil/uL (ref 3.87–5.11)
RDW: 13.5 % (ref 11.5–15.5)
WBC: 6.4 10*3/uL (ref 4.0–10.5)

## 2016-02-22 LAB — LIPID PANEL
CHOLESTEROL: 268 mg/dL — AB (ref 0–200)
HDL: 43.2 mg/dL (ref >39.00)
NONHDL: 224.44
Total CHOL/HDL Ratio: 6
Triglycerides: 283 mg/dL — ABNORMAL HIGH (ref 0.0–149.0)
VLDL: 56.6 mg/dL — ABNORMAL HIGH (ref 0.0–40.0)

## 2016-02-22 LAB — COMPREHENSIVE METABOLIC PANEL
ALBUMIN: 3.9 g/dL (ref 3.5–5.2)
ALT: 16 U/L (ref 0–35)
AST: 19 U/L (ref 0–37)
Alkaline Phosphatase: 54 U/L (ref 39–117)
BILIRUBIN TOTAL: 0.5 mg/dL (ref 0.2–1.2)
BUN: 22 mg/dL (ref 6–23)
CO2: 29 mEq/L (ref 19–32)
Calcium: 10.1 mg/dL (ref 8.4–10.5)
Chloride: 101 mEq/L (ref 96–112)
Creatinine, Ser: 1.06 mg/dL (ref 0.40–1.20)
GFR: 54.47 mL/min — AB (ref >60.00)
Glucose, Bld: 78 mg/dL (ref 70–99)
Potassium: 3.7 mEq/L (ref 3.5–5.1)
SODIUM: 138 meq/L (ref 135–145)
Total Protein: 6.8 g/dL (ref 6.0–8.3)

## 2016-02-22 LAB — LDL CHOLESTEROL, DIRECT: Direct LDL: 149 mg/dL

## 2016-02-22 LAB — T4, FREE: Free T4: 0.88 ng/dL (ref 0.60–1.60)

## 2016-02-22 MED ORDER — TETANUS-DIPHTHERIA TOXOIDS TD 5-2 LFU IM INJ: 0.5000 mL | INJECTION | Freq: Once | INTRAMUSCULAR | Status: DC

## 2016-02-22 MED ORDER — ESTRADIOL 14 MCG/24HR TD PTWK: 1.0000 | MEDICATED_PATCH | TRANSDERMAL | Status: DC

## 2016-02-22 NOTE — Patient Instructions (Signed)
Please set up your screening mammogram in May.  DASH Eating Plan DASH stands for "Dietary Approaches to Stop Hypertension." The DASH eating plan is a healthy eating plan that has been shown to reduce high blood pressure (hypertension). Additional health benefits may include reducing the risk of type 2 diabetes mellitus, heart disease, and stroke. The DASH eating plan may also help with weight loss. WHAT DO I NEED TO KNOW ABOUT THE DASH EATING PLAN? For the DASH eating plan, you will follow these general guidelines:  Choose foods with a percent daily value for sodium of less than 5% (as listed on the food label).  Use salt-free seasonings or herbs instead of table salt or sea salt.  Check with your health care provider or pharmacist before using salt substitutes.  Eat lower-sodium products, often labeled as "lower sodium" or "no salt added."  Eat fresh foods.  Eat more vegetables, fruits, and low-fat dairy products.  Choose whole grains. Look for the word "whole" as the first word in the ingredient list.  Choose fish and skinless chicken or [place] more often than red meat. Limit fish, poultry, and meat to 6 oz (170 g) each day.  Limit sweets, desserts, sugars, and sugary drinks.  Choose heart-healthy fats.  Limit cheese to 1 oz (28 g) per day.  Eat more home-cooked food and less restaurant, buffet, and fast food.  Limit fried foods.  Cook foods using methods other than frying.  Limit canned vegetables. If you do use them, rinse them well to decrease the sodium.  When eating at a restaurant, ask that your food be prepared with less salt, or no salt if possible. WHAT FOODS CAN I EAT? Seek help from a dietitian for individual calorie needs. Grains Whole grain or whole wheat bread. Brown rice. Whole grain or whole wheat pasta. Quinoa, bulgur, and whole grain cereals. Low-sodium cereals. Corn or whole wheat flour tortillas. Whole grain cornbread. Whole grain crackers. Low-sodium  crackers. Vegetables Fresh or frozen vegetables (raw, steamed, roasted, or grilled). Low-sodium or reduced-sodium tomato and vegetable juices. Low-sodium or reduced-sodium tomato sauce and paste. Low-sodium or reduced-sodium canned vegetables.  Fruits All fresh, canned (in natural juice), or frozen fruits. Meat and Other Protein Products Ground beef (85% or leaner), grass-fed beef, or beef trimmed of fat. Skinless chicken or [place]. Ground chicken or [place]. Pork trimmed of fat. All fish and seafood. Eggs. Dried beans, peas, or lentils. Unsalted nuts and seeds. Unsalted canned beans. Dairy Low-fat dairy products, such as skim or 1% milk, 2% or reduced-fat cheeses, low-fat ricotta or cottage cheese, or plain low-fat yogurt. Low-sodium or reduced-sodium cheeses. Fats and Oils Tub margarines without trans fats. Light or reduced-fat mayonnaise and salad dressings (reduced sodium). Avocado. Safflower, olive, or canola oils. Natural peanut or almond butter. Other Unsalted popcorn and pretzels. The items listed above may not be a complete list of recommended foods or beverages. Contact your dietitian for more options. WHAT FOODS ARE NOT RECOMMENDED? Grains White bread. White pasta. White rice. Refined cornbread. Bagels and croissants. Crackers that contain trans fat. Vegetables Creamed or fried vegetables. Vegetables in a cheese sauce. Regular canned vegetables. Regular canned tomato sauce and paste. Regular tomato and vegetable juices. Fruits Dried fruits. Canned fruit in light or heavy syrup. Fruit juice. Meat and Other Protein Products Fatty cuts of meat. Ribs, chicken wings, bacon, sausage, bologna, salami, chitterlings, fatback, hot dogs, bratwurst, and packaged luncheon meats. Salted nuts and seeds. Canned beans with salt. Dairy Whole or 2% milk, cream,  half-and-half, and cream cheese. Whole-fat or sweetened yogurt. Full-fat cheeses or blue cheese. Nondairy creamers and whipped toppings.  Processed cheese, cheese spreads, or cheese curds. Condiments Onion and garlic salt, seasoned salt, table salt, and sea salt. Canned and packaged gravies. Worcestershire sauce. Tartar sauce. Barbecue sauce. Teriyaki sauce. Soy sauce, including reduced sodium. Steak sauce. Fish sauce. Oyster sauce. Cocktail sauce. Horseradish. Ketchup and mustard. Meat flavorings and tenderizers. Bouillon cubes. Hot sauce. Tabasco sauce. Marinades. Taco seasonings. Relishes. Fats and Oils Butter, stick margarine, lard, shortening, ghee, and bacon fat. Coconut, palm kernel, or palm oils. Regular salad dressings. Other Pickles and olives. Salted popcorn and pretzels. The items listed above may not be a complete list of foods and beverages to avoid. Contact your dietitian for more information. WHERE CAN I FIND MORE INFORMATION? National Heart, Lung, and Blood Institute: travelstabloid.com   This information is not intended to replace advice given to you by your health care provider. Make sure you discuss any questions you have with your health care provider.   Document Released: 10/18/2011 Document Revised: 11/19/2014 Document Reviewed: 09/02/2013 Elsevier Interactive Patient Education [employer].

## 2016-02-22 NOTE — Assessment & Plan Note (Signed)
Mostly in knees Discussed muscle strengthening, etc

## 2016-02-22 NOTE — Assessment & Plan Note (Signed)
She will consider meds if LDL >190 (plan atorvastatin 20)

## 2016-02-22 NOTE — Progress Notes (Signed)
Subjective:    Patient ID: Sierra Salinas, female    DOB: 1946/10/19, 70 y.o.   MRN: [id number]  HPI  Here for Medicare wellness visit and follow up of chronic health issues Reviewed form and advanced directives Reviewed other doctors Occasional alcohol No tobacco Doesn't exercise-- discussed Vision is okay---did have procedure for glaucoma in past year (laser) Mild hearing problems---right slightly worse No falls No depression or anhedonia Independent with instrumental ADLs Still cares for man she has been with for 54 years Mom died in past year---lost those caregiver chores No apparent cognitive problems  No new concerns Foot pain is better now No significant issues unless she is on them for a long time  On the diuretic  Still has swelling by the end of the day No headaches No chest pain  No SOB No dizziness or syncope  Still on estrogen patch Stopped seeing the gyn--last time last year  Discussed hypercholesterolemia Would consider meds if way up  Current Outpatient Prescriptions on File Prior to Visit  Medication Sig Dispense Refill  . calcium citrate-vitamin D 200-200 MG-UNIT TABS Take 1 tablet by mouth daily.      . diphenhydrAMINE (BENADRYL) 25 MG tablet Take 25 mg by mouth at bedtime.      . fish oil-omega-3 fatty acids 1000 MG capsule Take 1,200 mg by mouth daily.    . Melatonin 1 MG TABS Take 1-3 mg by mouth at bedtime as needed.      Marland Kitchen MENOSTAR 14 MCG/24HR Place 1 patch onto the skin once a week.     . Multiple Vitamins-Minerals (CENTRUM SILVER ULTRA WOMENS) TABS Take by mouth daily.      . naproxen sodium (ANAPROX) 220 MG tablet Take 220 mg by mouth daily.    Marland Kitchen triamterene-hydrochlorothiazide (DYAZIDE) 37.5-25 MG per capsule TAKE 1 EACH (1 CAPSULE TOTAL) BY MOUTH DAILY. 90 capsule 3   No current facility-administered medications on file prior to visit.    Allergies  Allergen Reactions  . Nitrofurantoin     Past Medical History  Diagnosis Date    . Allergy   . Osteopenia   . Cystitis     recurrent  . Squamous cell carcinoma ([place]) 2000    right leg  . Unspecified venous (peripheral) insufficiency   . Hyperlipidemia   . Osteoarthritis of multiple joints     Past Surgical History  Procedure Laterality Date  . Abdominal hysterectomy    . Nasal septum surgery      Family History  Problem Relation Age of Onset  . Arthritis Mother   . Hypertension Mother   . Ovarian cancer Maternal Aunt 80  . Stroke Maternal Grandmother   . Colon cancer Maternal Aunt 67    Social History   Social History  . Marital Status: Single    Spouse Name: N/A  . Number of Children: 2  . Years of Education: N/A   Occupational History  . personal assistant    Social History Main Topics  . Smoking status: Former Research scientist (life sciences)  . Smokeless tobacco: Never Used  . Alcohol Use: Yes     Comment: occassional  . Drug Use: No  . Sexual Activity: Not on file   Other Topics Concern  . Not on file   Social History Narrative   Mostly caregives for long term companion   Has living will   Has designated daughter [name] to be health care POA   Would accept resuscitation attempts---no prolonged artificial ventilation  Would accept feeding tube for limited time depending on circumstances   Review of Systems General aches and pains--no significant arthritis issues Does have pain in knees on stairs Appetite is fine Weight is stable Sleeps well with benedryl --- discussed trying to skip doses. Also takes melatonin Wears seat belt Teeth okay---keeps up with dentist Easy bruising--no suspicious lesions. Sees derm regularly Bowels are okay. No blood in stool. Colonoscopy scheduled for June Voids fine No heartburn or swallowing problems    Objective:   Physical Exam  Constitutional: She is oriented to person, place, and time. She appears well-developed and well-nourished. No distress.  HENT:  Mouth/Throat: Oropharynx is clear and moist. No oropharyngeal  exudate.  Neck: Normal range of motion. Neck supple. No thyromegaly present.  Cardiovascular: Normal rate, regular rhythm, normal heart sounds and intact distal pulses.  Exam reveals no gallop.   No murmur heard. Pulmonary/Chest: Effort normal and breath sounds normal. No respiratory distress. She has no wheezes. She has no rales.  Abdominal: Soft. There is no tenderness.  Musculoskeletal: She exhibits no edema or tenderness.  Lymphadenopathy:    She has no cervical adenopathy.  Neurological: She is alert and oriented to person, place, and time.  President-- "Dwaine Deter, Bush" [phone] [id number] Recall 3/3  Skin: No rash noted. No erythema.  Psychiatric: She has a normal mood and affect. Her behavior is normal.          Assessment & Plan:

## 2016-02-22 NOTE — Assessment & Plan Note (Signed)
Still flushes at night despite the patch Will continue for now

## 2016-02-22 NOTE — Assessment & Plan Note (Signed)
Does okay on the diuretic Due for labs

## 2016-02-22 NOTE — Assessment & Plan Note (Signed)
I have personally reviewed the Medicare Annual Wellness questionnaire and have noted 1. The patient's medical and social history 2. Their use of alcohol, tobacco or illicit drugs 3. Their current medications and supplements 4. The patient's functional ability including ADL's, fall risks, home safety risks and hearing or visual             impairment. 5. Diet and physical activities 6. Evidence for depression or mood disorders  The patients weight, height, BMI and visual acuity have been recorded in the chart I have made referrals, counseling and provided education to the patient based review of the above and I have provided the pt with a written personalized care plan for preventive services.  I have provided you with a copy of your personalized plan for preventive services. Please take the time to review along with your updated medication list.  Rx for Td Mammogram due in May Colonoscopy scheduled for June

## 2016-02-22 NOTE — Assessment & Plan Note (Signed)
See social history 

## 2016-03-01 ENCOUNTER — Other Ambulatory Visit: Payer: Self-pay | Admitting: Internal Medicine

## 2016-03-05 ENCOUNTER — Other Ambulatory Visit: Payer: Self-pay | Admitting: Internal Medicine

## 2016-03-06 ENCOUNTER — Ambulatory Visit (INDEPENDENT_AMBULATORY_CARE_PROVIDER_SITE_OTHER): Payer: Medicare Other | Admitting: Internal Medicine

## 2016-03-06 ENCOUNTER — Encounter: Payer: Self-pay | Admitting: Internal Medicine

## 2016-03-06 VITALS — BP 122/76 | HR 89 | Temp 97.7°F | Wt 181.0 lb

## 2016-03-06 DIAGNOSIS — N644 Mastodynia: Secondary | ICD-10-CM

## 2016-03-06 NOTE — Progress Notes (Signed)
   Subjective:    Patient ID: Sierra Salinas, female    DOB: 1946/01/07, 70 y.o.   MRN: [id number]  HPI Here due to tenderness in left breast Just in the past few days  Tingling sensation--then pressed and noticed some tenderness No lump No nipple discharge  Current Outpatient Prescriptions on File Prior to Visit  Medication Sig Dispense Refill  . calcium citrate-vitamin D 200-200 MG-UNIT TABS Take 1 tablet by mouth daily.      Marland Kitchen estradiol (MENOSTAR) 14 MCG/24HR Place 1 patch onto the skin once a week. 4 patch 11  . fish oil-omega-3 fatty acids 1000 MG capsule Take 1,200 mg by mouth daily.    . Melatonin 1 MG TABS Take 1-3 mg by mouth at bedtime as needed.      . Multiple Vitamins-Minerals (CENTRUM SILVER ULTRA WOMENS) TABS Take by mouth daily.      . naproxen sodium (ANAPROX) 220 MG tablet Take 220 mg by mouth daily.    Marland Kitchen triamterene-hydrochlorothiazide (DYAZIDE) 37.5-25 MG capsule TAKE 1 CAPSULE BY MOUTH DAILY. 90 capsule 3   No current facility-administered medications on file prior to visit.    Allergies  Allergen Reactions  . Nitrofurantoin     Past Medical History  Diagnosis Date  . Allergy   . Osteopenia   . Cystitis     recurrent  . Squamous cell carcinoma ([place]) 2000    right leg  . Unspecified venous (peripheral) insufficiency   . Hyperlipidemia   . Osteoarthritis of multiple joints     Past Surgical History  Procedure Laterality Date  . Abdominal hysterectomy    . Nasal septum surgery      Family History  Problem Relation Age of Onset  . Arthritis Mother   . Hypertension Mother   . Ovarian cancer Maternal Aunt 80  . Stroke Maternal Grandmother   . Colon cancer Maternal Aunt 67    Social History   Social History  . Marital Status: Single    Spouse Name: N/A  . Number of Children: 2  . Years of Education: N/A   Occupational History  . personal assistant    Social History Main Topics  . Smoking status: Former Research scientist (life sciences)  . Smokeless tobacco:  Never Used  . Alcohol Use: Yes     Comment: occassional  . Drug Use: No  . Sexual Activity: Not on file   Other Topics Concern  . Not on file   Social History Narrative   Mostly caregives for long term companion   Has living will   Has designated daughter [name] to be health care POA   Would accept resuscitation attempts---no prolonged artificial ventilation   Would accept feeding tube for limited time depending on circumstances   Review of Systems Did try to extend the patch to 10 days No change in hot feeling with this--- still gets the night hot feeling No fall, no trauma No work with hands above her head or apparent pectoral strain (that is where she notes the symptoms) No cough or SOB    Objective:   Physical Exam  Pulmonary/Chest: She exhibits no tenderness.  Genitourinary:  No breast masses bilaterally Fairly dense but benign exam  Lymphadenopathy:    She has no axillary adenopathy.          Assessment & Plan:

## 2016-03-06 NOTE — Assessment & Plan Note (Signed)
No breast findings Probably more in pectoral or underlying costal cartilage Will proceed with routine mammo Recheck if worsens--consider surgeon if specific breast symptoms

## 2016-03-06 NOTE — Progress Notes (Signed)
Pre visit review using our clinic review tool, if applicable. No additional management support is needed unless otherwise documented below in the visit note. 

## 2016-03-06 NOTE — Patient Instructions (Signed)
Go ahead and set up the screening mammogram. Let me know if your symptoms worsen or persist.

## 2016-03-13 ENCOUNTER — Other Ambulatory Visit: Payer: Self-pay | Admitting: Internal Medicine

## 2016-03-13 DIAGNOSIS — Z1231 Encounter for screening mammogram for malignant neoplasm of breast: Secondary | ICD-10-CM

## 2016-04-05 ENCOUNTER — Ambulatory Visit
Admission: RE | Admit: 2016-04-05 | Discharge: 2016-04-05 | Disposition: A | Discharge status: Home / Self Care | Payer: Medicare Other | Source: Ambulatory Visit | Attending: Internal Medicine | Admitting: Internal Medicine

## 2016-04-05 DIAGNOSIS — Z1231 Encounter for screening mammogram for malignant neoplasm of breast: Secondary | ICD-10-CM | POA: Diagnosis present

## 2016-04-10 ENCOUNTER — Other Ambulatory Visit: Payer: Self-pay | Admitting: Internal Medicine

## 2016-04-16 ENCOUNTER — Ambulatory Visit (AMBULATORY_SURGERY_CENTER): Payer: Self-pay | Admitting: *Deleted

## 2016-04-16 VITALS — Ht 67.0 in | Wt 180.0 lb

## 2016-04-16 DIAGNOSIS — Z1211 Encounter for screening for malignant neoplasm of colon: Secondary | ICD-10-CM

## 2016-04-16 NOTE — Progress Notes (Signed)
No egg or soy allergy. No anesthesia problems.  No home O2.  No diet meds.  

## 2016-04-17 ENCOUNTER — Encounter: Payer: Self-pay | Admitting: Gastroenterology

## 2016-04-30 ENCOUNTER — Ambulatory Visit (AMBULATORY_SURGERY_CENTER): Payer: Medicare Other | Admitting: Gastroenterology

## 2016-04-30 ENCOUNTER — Encounter: Payer: Self-pay | Admitting: Gastroenterology

## 2016-04-30 VITALS — BP 122/66 | HR 73 | Temp 97.1°F | Resp 18 | Ht 67.0 in | Wt 180.0 lb

## 2016-04-30 DIAGNOSIS — Z1211 Encounter for screening for malignant neoplasm of colon: Secondary | ICD-10-CM

## 2016-04-30 MED ORDER — SODIUM CHLORIDE 0.9 % IV SOLN: 500.0000 mL | INTRAVENOUS | Status: DC

## 2016-04-30 NOTE — Progress Notes (Signed)
Report given to PACU RN, vss 

## 2016-04-30 NOTE — Op Note (Addendum)
[facility] Patient Name: Sierra Salinas Procedure Date: 04/30/2016 10:32 AM MRN: [id number] Endoscopist: Mallie Mussel L. Loletha Carrow , MD Age: 70 Referring MD:  Date of Birth: 10-Feb-1946 Gender: Female Account #: [id number] Procedure:                Colonoscopy Indications:              Screening for colorectal malignant neoplasm (last                            colonoscopy 2006) Medicines:                Monitored Anesthesia Care Procedure:                Pre-Anesthesia Assessment:                           - Prior to the procedure, a History and Physical                            was performed, and patient medications and                            allergies were reviewed. The patient's tolerance of                            previous anesthesia was also reviewed. The risks                            and benefits of the procedure and the sedation                            options and risks were discussed with the patient.                            All questions were answered, and informed consent                            was obtained. Prior Anticoagulants: The patient has                            taken no previous anticoagulant or antiplatelet                            agents. ASA Grade Assessment: II - A patient with                            mild systemic disease. After reviewing the risks                            and benefits, the patient was deemed in                            satisfactory condition to undergo the procedure.  After obtaining informed consent, the colonoscope                            was passed under direct vision. Throughout the                            procedure, the patient's blood pressure, pulse, and                            oxygen saturations were monitored continuously. The                            Model CF-HQ190L [id number]) scope was introduced                            through the anus and advanced to the the  cecum,                            identified by appendiceal orifice and ileocecal                            valve. The colonoscopy was performed without                            difficulty. The patient tolerated the procedure                            well. The quality of the bowel preparation was                            excellent. The ileocecal valve, appendiceal                            orifice, and rectum were photographed. Scope In: 10:43:11 AM Scope Out: 10:56:24 AM Scope Withdrawal Time: 0 hours 8 minutes 34 seconds  Total Procedure Duration: 0 hours 13 minutes 13 seconds  Findings:                 The perianal and digital rectal examinations were                            normal.                           Internal hemorrhoids were found during anoscopy.                            The hemorrhoids were Grade I (internal hemorrhoids                            that do not prolapse).                           Retroflexion in the rectum was not performed due to  anatomy.                           The exam was otherwise without abnormality. Complications:            No immediate complications. Estimated Blood Loss:     Estimated blood loss: none. Impression:               - Internal hemorrhoids.                           - The examination was otherwise normal.                           - No specimens collected. Recommendation:           - Patient has a contact number available for                            emergencies. The signs and symptoms of potential                            delayed complications were discussed with the                            patient. Return to normal activities tomorrow.                            Written discharge instructions were provided to the                            patient.                           - Resume previous diet.                           - Continue present medications.                           -  Repeat colonoscopy in 10 years for screening                            purposes. Toure Edmonds L. Loletha Carrow, MD 04/30/2016 11:01:15 AM This report has been signed electronically.

## 2016-04-30 NOTE — Patient Instructions (Signed)
YOU HAD AN ENDOSCOPIC PROCEDURE TODAY AT THE [facility]:   Refer to the procedure report that was given to you for any specific questions about what was found during the examination.  If the procedure report does not answer your questions, please call your gastroenterologist to clarify.  If you requested that your care partner not be given the details of your procedure findings, then the procedure report has been included in a sealed envelope for you to review at your convenience later.  YOU SHOULD EXPECT: Some feelings of bloating in the abdomen. Passage of more gas than usual.  Walking can help get rid of the air that was put into your GI tract during the procedure and reduce the bloating. If you had a lower endoscopy (such as a colonoscopy or flexible sigmoidoscopy) you may notice spotting of blood in your stool or on the toilet paper. If you underwent a bowel prep for your procedure, you may not have a normal bowel movement for a few days.  Please Note:  You might notice some irritation and congestion in your nose or some drainage.  This is from the oxygen used during your procedure.  There is no need for concern and it should clear up in a day or so.  SYMPTOMS TO REPORT IMMEDIATELY:   Following lower endoscopy (colonoscopy or flexible sigmoidoscopy):  Excessive amounts of blood in the stool  Significant tenderness or worsening of abdominal pains  Swelling of the abdomen that is new, acute  Fever of 100F or higher   For urgent or emergent issues, a gastroenterologist can be reached at any hour by calling [phone].   DIET: Your first meal following the procedure should be a small meal and then it is ok to progress to your normal diet. Heavy or fried foods are harder to digest and may make you feel nauseous or bloated.  Likewise, meals heavy in dairy and vegetables can increase bloating.  Drink plenty of fluids but you should avoid alcoholic beverages for 24  hours.  ACTIVITY:  You should plan to take it easy for the rest of today and you should NOT DRIVE or use heavy machinery until tomorrow (because of the sedation medicines used during the test).    FOLLOW UP: Our staff will call the number listed on your records the next business day following your procedure to check on you and address any questions or concerns that you may have regarding the information given to you following your procedure. If we do not reach you, we will leave a message.  However, if you are feeling well and you are not experiencing any problems, there is no need to return our call.  We will assume that you have returned to your regular daily activities without incident.  If any biopsies were taken you will be contacted by phone or by letter within the next 1-3 weeks.  Please call us at [phone] if you have not heard about the biopsies in 3 weeks.    SIGNATURES/CONFIDENTIALITY: You and/or your care partner have signed paperwork which will be entered into your electronic medical record.  These signatures attest to the fact that that the information above on your After Visit Summary has been reviewed and is understood.  Full responsibility of the confidentiality of this discharge information lies with you and/or your care-partner. 

## 2016-05-01 ENCOUNTER — Telehealth: Payer: Self-pay

## 2016-05-01 NOTE — Telephone Encounter (Signed)
  Follow up Call-  Call back number 04/30/2016  Post procedure Call Back phone  # [phone]  Permission to leave phone message Yes     Patient questions:  Do you have a fever, pain , or abdominal swelling? No. Pain Score  0 *  Have you tolerated food without any problems? Yes.    Have you been able to return to your normal activities? Yes.    Do you have any questions about your discharge instructions: Diet   No. Medications  No. Follow up visit  No.  Do you have questions or concerns about your Care? No.  Actions: * If pain score is 4 or above: No action needed, pain <4.

## 2016-10-15 ENCOUNTER — Ambulatory Visit (INDEPENDENT_AMBULATORY_CARE_PROVIDER_SITE_OTHER): Payer: Medicare Other

## 2016-10-15 DIAGNOSIS — Z23 Encounter for immunization: Secondary | ICD-10-CM | POA: Diagnosis not present

## 2016-12-06 ENCOUNTER — Telehealth: Payer: Self-pay

## 2016-12-06 NOTE — Telephone Encounter (Signed)
Form filled out and faxed back to [facility]. Waiting on response

## 2016-12-06 NOTE — Telephone Encounter (Signed)
Fax received that medication is approved until 11-11-17

## 2016-12-31 ENCOUNTER — Encounter: Payer: Self-pay | Admitting: Primary Care

## 2016-12-31 ENCOUNTER — Ambulatory Visit (INDEPENDENT_AMBULATORY_CARE_PROVIDER_SITE_OTHER): Payer: Medicare HMO | Admitting: Primary Care

## 2016-12-31 VITALS — BP 130/84 | HR 100 | Temp 98.0°F | Ht 66.75 in | Wt 177.1 lb

## 2016-12-31 DIAGNOSIS — J069 Acute upper respiratory infection, unspecified: Secondary | ICD-10-CM

## 2016-12-31 MED ORDER — BENZONATATE 200 MG PO CAPS: 200.0000 mg | ORAL_CAPSULE | Freq: Three times a day (TID) | ORAL | 0 refills | Status: DC | PRN

## 2016-12-31 MED ORDER — AMOXICILLIN 500 MG PO CAPS: 500.0000 mg | ORAL_CAPSULE | Freq: Two times a day (BID) | ORAL | 0 refills | Status: DC

## 2016-12-31 NOTE — Patient Instructions (Signed)
Your symptoms are representative of a viral illness which will resolve on its own over time. Our goal is to treat your symptoms in order to aid your body in the healing process and to make you more comfortable.   You may take Benzonatate capsules for cough. Take 1 capsule by mouth three times daily as needed for cough.  If you feel worse in 24 hours, fill the antibiotic. If you feel the same after 48 hours, fill the antibiotic. If you feel better in 24-48 hours, do not fill the antibiotics.   Ensure you are staying hydrated and rest.  It was a pleasure meeting you!   Upper Respiratory Infection, Adult Most upper respiratory infections (URIs) are a viral infection of the air passages leading to the lungs. A URI affects the nose, throat, and upper air passages. The most common type of URI is nasopharyngitis and is typically referred to as "the common cold." URIs run their course and usually go away on their own. Most of the time, a URI does not require medical attention, but sometimes a bacterial infection in the upper airways can follow a viral infection. This is called a secondary infection. Sinus and middle ear infections are common types of secondary upper respiratory infections. Bacterial pneumonia can also complicate a URI. A URI can worsen asthma and chronic obstructive pulmonary disease (COPD). Sometimes, these complications can require emergency medical care and may be life threatening. What are the causes? Almost all URIs are caused by viruses. A virus is a type of germ and can spread from one person to another. What increases the risk? You may be at risk for a URI if:  You smoke.  You have chronic heart or lung disease.  You have a weakened defense (immune) system.  You are very young or very old.  You have nasal allergies or asthma.  You work in crowded or poorly ventilated areas.  You work in health care facilities or schools. What are the signs or symptoms? Symptoms  typically develop 2-3 days after you come in contact with a cold virus. Most viral URIs last 7-10 days. However, viral URIs from the influenza virus (flu virus) can last 14-18 days and are typically more severe. Symptoms may include:  Runny or stuffy (congested) nose.  Sneezing.  Cough.  Sore throat.  Headache.  Fatigue.  Fever.  Loss of appetite.  Pain in your forehead, behind your eyes, and over your cheekbones (sinus pain).  Muscle aches. How is this diagnosed? Your health care provider may diagnose a URI by:  Physical exam.  Tests to check that your symptoms are not due to another condition such as:  Strep throat.  Sinusitis.  Pneumonia.  Asthma. How is this treated? A URI goes away on its own with time. It cannot be cured with medicines, but medicines may be prescribed or recommended to relieve symptoms. Medicines may help:  Reduce your fever.  Reduce your cough.  Relieve nasal congestion. Follow these instructions at home:  Take medicines only as directed by your health care provider.  Gargle warm saltwater or take cough drops to comfort your throat as directed by your health care provider.  Use a warm mist humidifier or inhale steam from a shower to increase air moisture. This may make it easier to breathe.  Drink enough fluid to keep your urine clear or pale yellow.  Eat soups and other clear broths and maintain good nutrition.  Rest as needed.  Return to work when your  temperature has returned to normal or as your health care provider advises. You may need to stay home longer to avoid infecting others. You can also use a face mask and careful hand washing to prevent spread of the virus.  Increase the usage of your inhaler if you have asthma.  Do not use any tobacco products, including cigarettes, chewing tobacco, or electronic cigarettes. If you need help quitting, ask your health care provider. How is this prevented? The best way to protect  yourself from getting a cold is to practice good hygiene.  Avoid oral or hand contact with people with cold symptoms.  Wash your hands often if contact occurs. There is no clear evidence that vitamin C, vitamin E, echinacea, or exercise reduces the chance of developing a cold. However, it is always recommended to get plenty of rest, exercise, and practice good nutrition. Contact a health care provider if:  You are getting worse rather than better.  Your symptoms are not controlled by medicine.  You have chills.  You have worsening shortness of breath.  You have brown or red mucus.  You have yellow or brown nasal discharge.  You have pain in your face, especially when you bend forward.  You have a fever.  You have swollen neck glands.  You have pain while swallowing.  You have white areas in the back of your throat. Get help right away if:  You have severe or persistent:  Headache.  Ear pain.  Sinus pain.  Chest pain.  You have chronic lung disease and any of the following:  Wheezing.  Prolonged cough.  Coughing up blood.  A change in your usual mucus.  You have a stiff neck.  You have changes in your:  Vision.  Hearing.  Thinking.  Mood. This information is not intended to replace advice given to you by your health care provider. Make sure you discuss any questions you have with your health care provider. Document Released: 04/24/2001 Document Revised: 07/01/2016 Document Reviewed: 02/03/2014 Elsevier Interactive Patient Education  2017 [employer].

## 2016-12-31 NOTE — Progress Notes (Signed)
Pre visit review using our clinic review tool, if applicable. No additional management support is needed unless otherwise documented below in the visit note. 

## 2016-12-31 NOTE — Progress Notes (Signed)
Subjective:    Patient ID: Sierra Salinas, female    DOB: February 02, 1946, 71 y.o.   MRN: [id number]  HPI  Sierra Salinas is a 71 year old female with a history of allergic rhinits who presents today with a chief complaint of cough. She also reports fatigue, chest tightness, shortness of breath, chest congestion. Her symptoms began 1 week ago. She denies fevers, productive cough, sick contacts. She's taken Mucinex without much improvement.   Review of Systems  Constitutional: Positive for chills and fatigue. Negative for fever.  HENT: Positive for congestion and sore throat. Negative for ear pain and sinus pressure.   Respiratory: Positive for cough, chest tightness and shortness of breath.        Past Medical History:  Diagnosis Date  . Allergy   . Cystitis    recurrent  . Hyperlipidemia   . Osteoarthritis of multiple joints   . Osteopenia   . 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 Unspecified venous (peripheral) insufficiency      Social History   Social History  . Marital status: Single    Spouse name: N/A  . Number of children: 2  . Years of education: N/A   Occupational History  . 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 Smoking status: Former Research scientist (life sciences)  . Smokeless tobacco: Never Used  . Alcohol use 0.0 oz/week     Comment: occassional  . Drug use: No  . Sexual activity: Not on file   Other Topics Concern  . Not on file   Social History Narrative   Mostly caregives for long term companion   Has living will   Has designated daughter Sierra Salinas to be health care POA   Would accept resuscitation attempts---no prolonged artificial ventilation   Would accept feeding tube for limited time depending on circumstances    Past Surgical History:  Procedure Laterality Date  . ABDOMINAL HYSTERECTOMY    . COLONOSCOPY    . NASAL SEPTUM SURGERY      Family History  Problem Relation Age of Onset  . Arthritis Mother   . Hypertension Mother   .  Ovarian cancer Maternal Aunt 80  . Stroke Maternal Grandmother   . Colon cancer Maternal Aunt 67    Allergies  Allergen Reactions  . Nitrofurantoin     Pt denies    Current Outpatient Prescriptions on File Prior to Visit  Medication Sig Dispense Refill  . calcium citrate-vitamin D 200-200 MG-UNIT TABS Take 1 tablet by mouth daily.      . fish oil-omega-3 fatty acids 1000 MG capsule Take 1,200 mg by mouth daily.    . Melatonin 1 MG TABS Take 1-3 mg by mouth at bedtime as needed.      Marland Kitchen MENOSTAR 14 MCG/24HR USE PATCH 3 WEEKS ON AND 1 WEEK OFF CYCLE. 4 patch 10  . Multiple Vitamins-Minerals (CENTRUM SILVER ULTRA WOMENS) TABS Take by mouth daily.      Marland Kitchen triamterene-hydrochlorothiazide (DYAZIDE) 37.5-25 MG capsule TAKE 1 CAPSULE BY MOUTH DAILY. 90 capsule 3   No current facility-administered medications on file prior to visit.     BP 130/84   Pulse 100   Temp 98 F (36.7 C) (Oral)   Ht 5' 6.75" (1.695 m)   Wt 177 lb 1.9 oz (80.3 kg)   SpO2 99%   BMI 27.95 kg/m    Objective:   Physical Exam  Constitutional: She appears well-nourished. She does not appear ill.  HENT:  Right Ear: Tympanic membrane and ear canal normal.  Left Ear: Tympanic membrane and ear canal normal.  Nose: Right sinus exhibits no maxillary sinus tenderness and no frontal sinus tenderness. Left sinus exhibits no maxillary sinus tenderness and no frontal sinus tenderness.  Mouth/Throat: Oropharynx is clear and moist.  Eyes: Conjunctivae are normal.  Neck: Neck supple.  Cardiovascular: Regular rhythm.   Sinus tachycardia at 100  Pulmonary/Chest: Effort normal and breath sounds normal. She has no wheezes. She has no rales.  Lymphadenopathy:    She has no cervical adenopathy.  Skin: Skin is warm and dry.          Assessment & Plan:  URI:  Cough, fatigue, chest congestion x 7 days. Little improvement with OTC treatment. Exam today with clear lungs, does not appear acutely ill, stable vitals. Suspect  she is nearing the end of a viral URI and will treat with conservative measures. Rx for Tessalon Pearls sent to pharmacy.  Rx for Amoxil course printed for her to fill in 48 hours if no improvement. She verbalized understanding. Fluids, rest, follow up PRN.  Sheral Flow, NP

## 2017-02-01 ENCOUNTER — Other Ambulatory Visit: Payer: Self-pay | Admitting: Internal Medicine

## 2017-02-20 ENCOUNTER — Other Ambulatory Visit: Payer: Self-pay | Admitting: Internal Medicine

## 2017-02-22 ENCOUNTER — Ambulatory Visit (INDEPENDENT_AMBULATORY_CARE_PROVIDER_SITE_OTHER): Payer: Medicare HMO | Admitting: Internal Medicine

## 2017-02-22 ENCOUNTER — Encounter: Payer: Self-pay | Admitting: Internal Medicine

## 2017-02-22 VITALS — BP 110/82 | HR 73 | Temp 97.4°F | Ht 67.5 in | Wt 178.0 lb

## 2017-02-22 DIAGNOSIS — Z0000 Encounter for general adult medical examination without abnormal findings: Secondary | ICD-10-CM

## 2017-02-22 DIAGNOSIS — Z7189 Other specified counseling: Secondary | ICD-10-CM

## 2017-02-22 DIAGNOSIS — E785 Hyperlipidemia, unspecified: Secondary | ICD-10-CM | POA: Diagnosis not present

## 2017-02-22 DIAGNOSIS — I872 Venous insufficiency (chronic) (peripheral): Secondary | ICD-10-CM

## 2017-02-22 DIAGNOSIS — N951 Menopausal and female climacteric states: Secondary | ICD-10-CM

## 2017-02-22 LAB — CBC WITH DIFFERENTIAL/PLATELET
BASOS ABS: 0.1 10*3/uL (ref 0.0–0.1)
Basophils Relative: 1 % (ref 0.0–3.0)
EOS ABS: 0.1 10*3/uL (ref 0.0–0.7)
Eosinophils Relative: 1.3 % (ref 0.0–5.0)
HEMATOCRIT: 44.9 % (ref 36.0–46.0)
HEMOGLOBIN: 15 g/dL (ref 12.0–15.0)
Lymphocytes Relative: 36.4 % (ref 12.0–46.0)
Lymphs Abs: 2.1 10*3/uL (ref 0.7–4.0)
MCHC: 33.4 g/dL (ref 30.0–36.0)
MCV: 93.8 fl (ref 78.0–100.0)
Monocytes Absolute: 0.6 10*3/uL (ref 0.1–1.0)
Monocytes Relative: 10.1 % (ref 3.0–12.0)
Neutro Abs: 3 10*3/uL (ref 1.4–7.7)
Neutrophils Relative %: 51.2 % (ref 43.0–77.0)
PLATELETS: 291 10*3/uL (ref 150.0–400.0)
RBC: 4.79 Mil/uL (ref 3.87–5.11)
RDW: 13.3 % (ref 11.5–15.5)
WBC: 5.9 10*3/uL (ref 4.0–10.5)

## 2017-02-22 LAB — LIPID PANEL
CHOLESTEROL: 306 mg/dL — AB (ref 0–200)
HDL: 45 mg/dL (ref >39.00)
NonHDL: 260.78
Total CHOL/HDL Ratio: 7
Triglycerides: 336 mg/dL — ABNORMAL HIGH (ref 0.0–149.0)
VLDL: 67.2 mg/dL — AB (ref 0.0–40.0)

## 2017-02-22 LAB — COMPREHENSIVE METABOLIC PANEL
ALBUMIN: 4.2 g/dL (ref 3.5–5.2)
ALT: 25 U/L (ref 0–35)
AST: 25 U/L (ref 0–37)
Alkaline Phosphatase: 63 U/L (ref 39–117)
BUN: 23 mg/dL (ref 6–23)
CHLORIDE: 103 meq/L (ref 96–112)
CO2: 30 mEq/L (ref 19–32)
CREATININE: 1.07 mg/dL (ref 0.40–1.20)
Calcium: 9.8 mg/dL (ref 8.4–10.5)
GFR: 53.73 mL/min — AB (ref >60.00)
Glucose, Bld: 107 mg/dL — ABNORMAL HIGH (ref 70–99)
POTASSIUM: 4.1 meq/L (ref 3.5–5.1)
Sodium: 140 mEq/L (ref 135–145)
Total Bilirubin: 0.7 mg/dL (ref 0.2–1.2)
Total Protein: 7.3 g/dL (ref 6.0–8.3)

## 2017-02-22 LAB — T4, FREE: Free T4: 0.77 ng/dL (ref 0.60–1.60)

## 2017-02-22 LAB — LDL CHOLESTEROL, DIRECT: Direct LDL: 170 mg/dL

## 2017-02-22 MED ORDER — TRIAMTERENE-HCTZ 37.5-25 MG PO CAPS: 1.0000 | ORAL_CAPSULE | Freq: Every day | ORAL | 3 refills | Status: DC

## 2017-02-22 MED ORDER — ESTRADIOL 0.5 MG PO TABS: 0.5000 mg | ORAL_TABLET | Freq: Every day | ORAL | 11 refills | Status: DC

## 2017-02-22 NOTE — Patient Instructions (Addendum)
Please change to the oral estrogen daily. If no problems after a month, try decreasing to 6 days a week---and then cut out 1 more pill weekly till you find the lowest dose that keeps away the flashes (or maybe you can stop it altogether).   DASH Eating Plan DASH stands for "Dietary Approaches to Stop Hypertension." The DASH eating plan is a healthy eating plan that has been shown to reduce high blood pressure (hypertension). It may also reduce your risk for type 2 diabetes, heart disease, and stroke. The DASH eating plan may also help with weight loss. What are tips for following this plan? General guidelines   Avoid eating more than 2,300 mg (milligrams) of salt (sodium) a day. If you have hypertension, you may need to reduce your sodium intake to 1,500 mg a day.  Limit alcohol intake to no more than 1 drink a day for nonpregnant women and 2 drinks a day for men. One drink equals 12 oz of beer, 5 oz of wine, or 1 oz of hard liquor.  Work with your health care provider to maintain a healthy body weight or to lose weight. Ask what an ideal weight is for you.  Get at least 30 minutes of exercise that causes your heart to beat faster (aerobic exercise) most days of the week. Activities may include walking, swimming, or biking.  Work with your health care provider or diet and nutrition specialist (dietitian) to adjust your eating plan to your individual calorie needs. Reading food labels   Check food labels for the amount of sodium per serving. Choose foods with less than 5 percent of the Daily Value of sodium. Generally, foods with less than 300 mg of sodium per serving fit into this eating plan.  To find whole grains, look for the word "whole" as the first word in the ingredient list. Shopping   Buy products labeled as "low-sodium" or "no salt added."  Buy fresh foods. Avoid canned foods and premade or frozen meals. Cooking   Avoid adding salt when cooking. Use salt-free seasonings or  herbs instead of table salt or sea salt. Check with your health care provider or pharmacist before using salt substitutes.  Do not fry foods. Cook foods using healthy methods such as baking, boiling, grilling, and broiling instead.  Cook with heart-healthy oils, such as olive, canola, soybean, or sunflower oil. Meal planning    Eat a balanced diet that includes:  5 or more servings of fruits and vegetables each day. At each meal, try to fill half of your plate with fruits and vegetables.  Up to 6-8 servings of whole grains each day.  Less than 6 oz of lean meat, poultry, or fish each day. A 3-oz serving of meat is about the same size as a deck of cards. One egg equals 1 oz.  2 servings of low-fat dairy each day.  A serving of nuts, seeds, or beans 5 times each week.  Heart-healthy fats. Healthy fats called Omega-3 fatty acids are found in foods such as flaxseeds and coldwater fish, like sardines, salmon, and mackerel.  Limit how much you eat of the following:  Canned or prepackaged foods.  Food that is high in trans fat, such as fried foods.  Food that is high in saturated fat, such as fatty meat.  Sweets, desserts, sugary drinks, and other foods with added sugar.  Full-fat dairy products.  Do not salt foods before eating.  Try to eat at least 2 vegetarian meals each  week.  Eat more home-cooked food and less restaurant, buffet, and fast food.  When eating at a restaurant, ask that your food be prepared with less salt or no salt, if possible. What foods are recommended? The items listed may not be a complete list. Talk with your dietitian about what dietary choices are best for you. Grains  Whole-grain or whole-wheat bread. Whole-grain or whole-wheat pasta. Brown rice. [place]row. Bulgur. Whole-grain and low-sodium cereals. Pita bread. Low-fat, low-sodium crackers. Whole-wheat flour tortillas. Vegetables  Fresh or frozen vegetables (raw, steamed, roasted, or  grilled). Low-sodium or reduced-sodium tomato and vegetable juice. Low-sodium or reduced-sodium tomato sauce and tomato paste. Low-sodium or reduced-sodium canned vegetables. Fruits  All fresh, dried, or frozen fruit. Canned fruit in natural juice (without added sugar). Meat and other protein foods  Skinless chicken or [place]. Ground chicken or [place]. Pork with fat trimmed off. Fish and seafood. Egg whites. Dried beans, peas, or lentils. Unsalted nuts, nut butters, and seeds. Unsalted canned beans. Lean cuts of beef with fat trimmed off. Low-sodium, lean deli meat. Dairy  Low-fat (1%) or fat-free (skim) milk. Fat-free, low-fat, or reduced-fat cheeses. Nonfat, low-sodium ricotta or cottage cheese. Low-fat or nonfat yogurt. Low-fat, low-sodium cheese. Fats and oils  Soft margarine without trans fats. Vegetable oil. Low-fat, reduced-fat, or light mayonnaise and salad dressings (reduced-sodium). Canola, safflower, olive, soybean, and sunflower oils. Avocado. Seasoning and other foods  Herbs. Spices. Seasoning mixes without salt. Unsalted popcorn and pretzels. Fat-free sweets. What foods are not recommended? The items listed may not be a complete list. Talk with your dietitian about what dietary choices are best for you. Grains  Baked goods made with fat, such as croissants, muffins, or some breads. Dry pasta or rice meal packs. Vegetables  Creamed or fried vegetables. Vegetables in a cheese sauce. Regular canned vegetables (not low-sodium or reduced-sodium). Regular canned tomato sauce and paste (not low-sodium or reduced-sodium). Regular tomato and vegetable juice (not low-sodium or reduced-sodium). Angie Fava. Olives. Fruits  Canned fruit in a light or heavy syrup. Fried fruit. Fruit in cream or butter sauce. Meat and other protein foods  Fatty cuts of meat. Ribs. Fried meat. Berniece Salines. Sausage. Bologna and other processed lunch meats. Salami. Fatback. Hotdogs. Bratwurst. Salted nuts and seeds. Canned  beans with added salt. Canned or smoked fish. Whole eggs or egg yolks. Chicken or [place] with skin. Dairy  Whole or 2% milk, cream, and half-and-half. Whole or full-fat cream cheese. Whole-fat or sweetened yogurt. Full-fat cheese. Nondairy creamers. Whipped toppings. Processed cheese and cheese spreads. Fats and oils  Butter. Stick margarine. Lard. Shortening. Ghee. Bacon fat. Tropical oils, such as coconut, palm kernel, or palm oil. Seasoning and other foods  Salted popcorn and pretzels. Onion salt, garlic salt, seasoned salt, table salt, and sea salt. Worcestershire sauce. Tartar sauce. Barbecue sauce. Teriyaki sauce. Soy sauce, including reduced-sodium. Steak sauce. Canned and packaged gravies. Fish sauce. Oyster sauce. Cocktail sauce. Horseradish that you find on the shelf. Ketchup. Mustard. Meat flavorings and tenderizers. Bouillon cubes. Hot sauce and Tabasco sauce. Premade or packaged marinades. Premade or packaged taco seasonings. Relishes. Regular salad dressings. Where to find more information:  National Heart, Lung, and Center: https://wilson-eaton.com/  American Heart Association: www.heart.org Summary  The DASH eating plan is a healthy eating plan that has been shown to reduce high blood pressure (hypertension). It may also reduce your risk for type 2 diabetes, heart disease, and stroke.  With the DASH eating plan, you should limit salt (sodium) intake to  2,300 mg a day. If you have hypertension, you may need to reduce your sodium intake to 1,500 mg a day.  When on the DASH eating plan, aim to eat more fresh fruits and vegetables, whole grains, lean proteins, low-fat dairy, and heart-healthy fats.  Work with your health care provider or diet and nutrition specialist (dietitian) to adjust your eating plan to your individual calorie needs. This information is not intended to replace advice given to you by your health care provider. Make sure you discuss any questions you have with  your health care provider. Document Released: 10/18/2011 Document Revised: 10/22/2016 Document Reviewed: 10/22/2016 Elsevier Interactive Patient Education  2017 [employer].

## 2017-02-22 NOTE — Assessment & Plan Note (Signed)
Mild Will not try statins

## 2017-02-22 NOTE — Assessment & Plan Note (Signed)
See social history 

## 2017-02-22 NOTE — Assessment & Plan Note (Signed)
Will change to oral estrogen and try to wean further

## 2017-02-22 NOTE — Progress Notes (Signed)
Subjective:    Patient ID: Sierra Salinas, female    DOB: 1946-05-19, 71 y.o.   MRN: [id number]  HPI Here for Medicare wellness visit and follow up of chronic health conditions Reviewed form and advanced directives Reviewed other doctors No tobacco or alcohol Has been trying to exercise--on treadmill Vision and hearing are okay No falls No depression or anhedonia. Still cares for her long time partner Independent with instrumental ADLs No troubling memory issues  No new concerns Still on diuretic Uses compression hose as well Still gets some swelling---worse in summertime----but bearable No chest pain or SOB No dizziness or syncope  Still on the hormone patch Has tried being off the patch for a week No flashes anymore Chronic sleep problems--not clearly hormonal  Takes fish oil---I just take it Mild elevated cholesterol in past  Current Outpatient Prescriptions on File Prior to Visit  Medication Sig Dispense Refill  . acetaminophen (TYLENOL) 325 MG tablet Take 650 mg by mouth every 6 (six) hours as needed.    . calcium citrate-vitamin D 200-200 MG-UNIT TABS Take 1 tablet by mouth daily.      . fish oil-omega-3 fatty acids 1000 MG capsule Take 1,200 mg by mouth daily.    . Melatonin 1 MG TABS Take 1-3 mg by mouth at bedtime as needed.      Marland Kitchen MENOSTAR 14 MCG/24HR USE PATCH 3 WEEKS ON AND 1 WEEK OFF CYCLE. 4 patch 1  . Multiple Vitamins-Minerals (CENTRUM SILVER ULTRA WOMENS) TABS Take by mouth daily.      Marland Kitchen triamterene-hydrochlorothiazide (DYAZIDE) 37.5-25 MG capsule TAKE 1 CAPSULE BY MOUTH DAILY. 90 capsule 3   No current facility-administered medications on file prior to visit.     Allergies  Allergen Reactions  . Nitrofurantoin     Pt denies    Past Medical History:  Diagnosis Date  . Allergy   . Cystitis    recurrent  . Hyperlipidemia   . Osteoarthritis of multiple joints   . Osteopenia   . 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 Unspecified venous  (peripheral) insufficiency     Past Surgical History:  Procedure Laterality Date  . ABDOMINAL HYSTERECTOMY    . COLONOSCOPY    . NASAL SEPTUM SURGERY      Family History  Problem Relation Age of Onset  . Arthritis Mother   . Hypertension Mother   . Ovarian cancer Maternal Aunt 80  . Stroke Maternal Grandmother   . Colon cancer Maternal Aunt 67    Social History   Social History  . Marital status: Single    Spouse name: N/A  . Number of children: 2  . Years of education: N/A   Occupational History  . personal assistant [employer]   Social History Main Topics  . Smoking status: Former Research scientist (life sciences)  . Smokeless tobacco: Never Used  . Alcohol use 0.0 oz/week     Comment: occassional  . Drug use: No  . Sexual activity: Not on file   Other Topics Concern  . Not on file   Social History Narrative   Mostly caregives for long term companion   Has living will   Has designated daughter [name] to be health care POA   Would accept resuscitation attempts---no prolonged artificial ventilation   Would accept feeding tube for limited time depending on circumstances   Review of Systems Appetite is okay Weight stable Wears seat belt Teeth are okay--- keeps up with dentist Carman Ching) No heartburn or  dysphagia Bowels are okay. No blood No urinary problems. No incontinence. Occasional nocturia No rash or suspicious skin lesions. Does see dermatologist (new one)    Objective:   Physical Exam  Constitutional: She is oriented to person, place, and time. She appears well-developed and well-nourished. No distress.  HENT:  Mouth/Throat: Oropharynx is clear and moist. No oropharyngeal exudate.  Neck: Normal range of motion. Neck supple. No thyromegaly present.  Cardiovascular: Normal rate, regular rhythm, normal heart sounds and intact distal pulses.  Exam reveals no gallop.   No murmur heard. Pulmonary/Chest: Effort normal and breath sounds normal. No respiratory distress. She has no  wheezes. She has no rales.  Abdominal: Soft. There is no tenderness.  Musculoskeletal: She exhibits no edema or tenderness.  Lymphadenopathy:    She has no cervical adenopathy.  Neurological: She is alert and oriented to person, place, and time.  President-- "[name]" [phone] D-l-r-o-w Recall 3/3  Skin: No rash noted. No erythema.  Psychiatric: She has a normal mood and affect. Her behavior is normal.          Assessment & Plan:

## 2017-02-22 NOTE — Assessment & Plan Note (Signed)
Doing okay with diuretic and support hose

## 2017-02-22 NOTE — Progress Notes (Signed)
Pre visit review using our clinic review tool, if applicable. No additional management support is needed unless otherwise documented below in the visit note. 

## 2017-02-22 NOTE — Assessment & Plan Note (Signed)
I have personally reviewed the Medicare Annual Wellness questionnaire and have noted 1. The patient's medical and social history 2. Their use of alcohol, tobacco or illicit drugs 3. Their current medications and supplements 4. The patient's functional ability including ADL's, fall risks, home safety risks and hearing or visual             impairment. 5. Diet and physical activities 6. Evidence for depression or mood disorders  The patients weight, height, BMI and visual acuity have been recorded in the chart I have made referrals, counseling and provided education to the patient based review of the above and I have provided the pt with a written personalized care plan for preventive services.  I have provided you with a copy of your personalized plan for preventive services. Please take the time to review along with your updated medication list.  Done with colons Mammogram due next year Yearly flu vaccine Discussed fitness and eating healthy

## 2017-02-25 ENCOUNTER — Other Ambulatory Visit: Payer: Self-pay | Admitting: Internal Medicine

## 2017-02-25 DIAGNOSIS — Z1231 Encounter for screening mammogram for malignant neoplasm of breast: Secondary | ICD-10-CM

## 2017-04-10 ENCOUNTER — Ambulatory Visit
Admission: RE | Admit: 2017-04-10 | Discharge: 2017-04-10 | Disposition: A | Discharge status: Home / Self Care | Payer: Medicare HMO | Source: Ambulatory Visit | Attending: Internal Medicine | Admitting: Internal Medicine

## 2017-04-10 DIAGNOSIS — Z1231 Encounter for screening mammogram for malignant neoplasm of breast: Secondary | ICD-10-CM | POA: Diagnosis not present

## 2017-05-30 IMAGING — MG MM DIGITAL SCREENING BILAT W/ TOMO W/ CAD
8 of 12 series · 8 of 28 positions shown · non-contrast
Comparison: Previous exam(s).

CLINICAL DATA: Screening.

EXAM:
2D DIGITAL SCREENING BILATERAL MAMMOGRAM WITH CAD AND ADJUNCT TOMO

[L CC synth-2D]
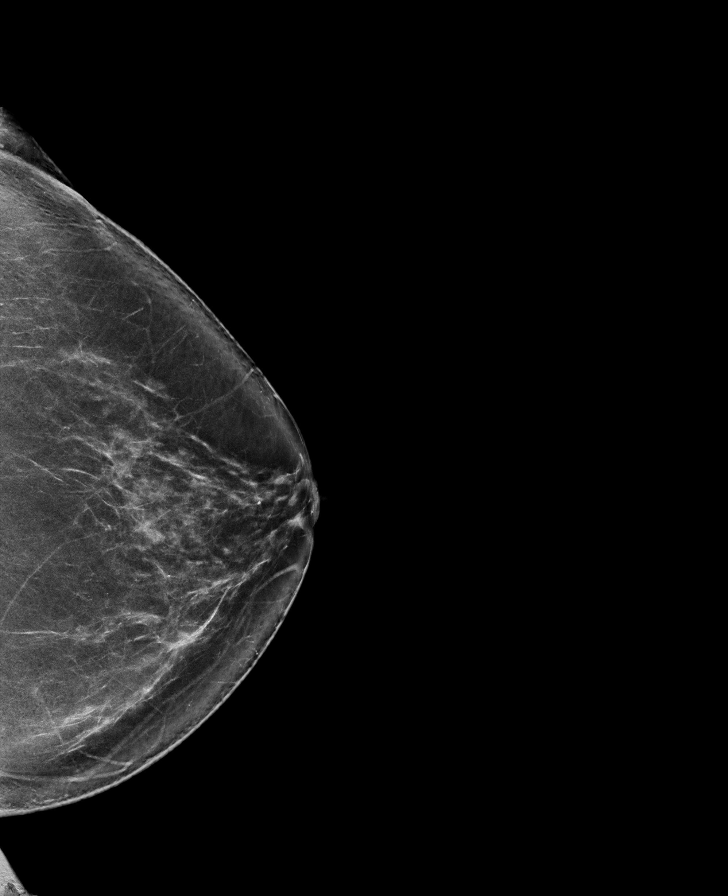

[R MLO synth-2D]
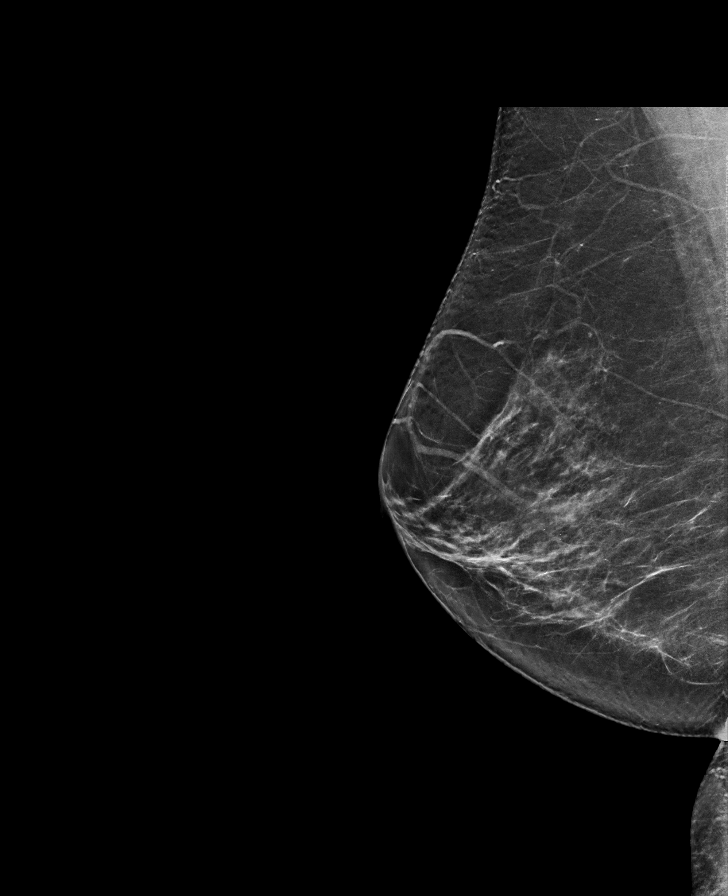

[R MLO]
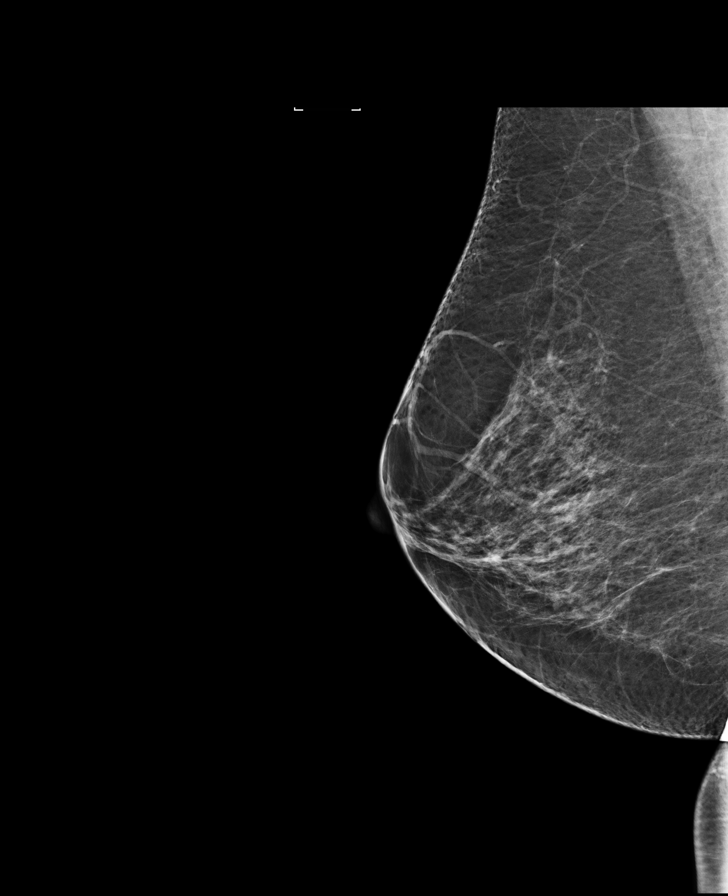

[L MLO synth-2D]
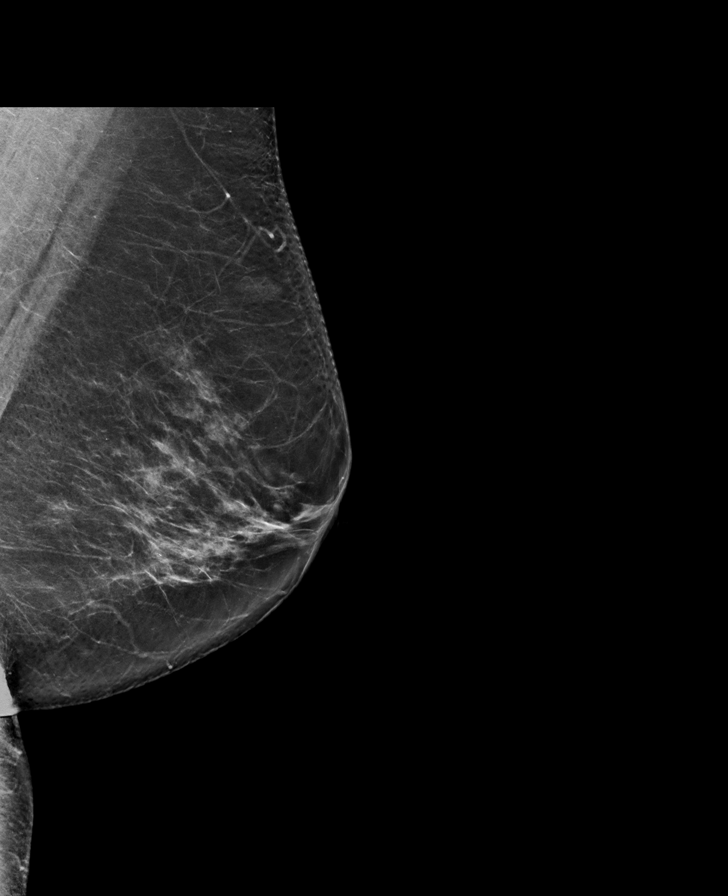

[L MLO]
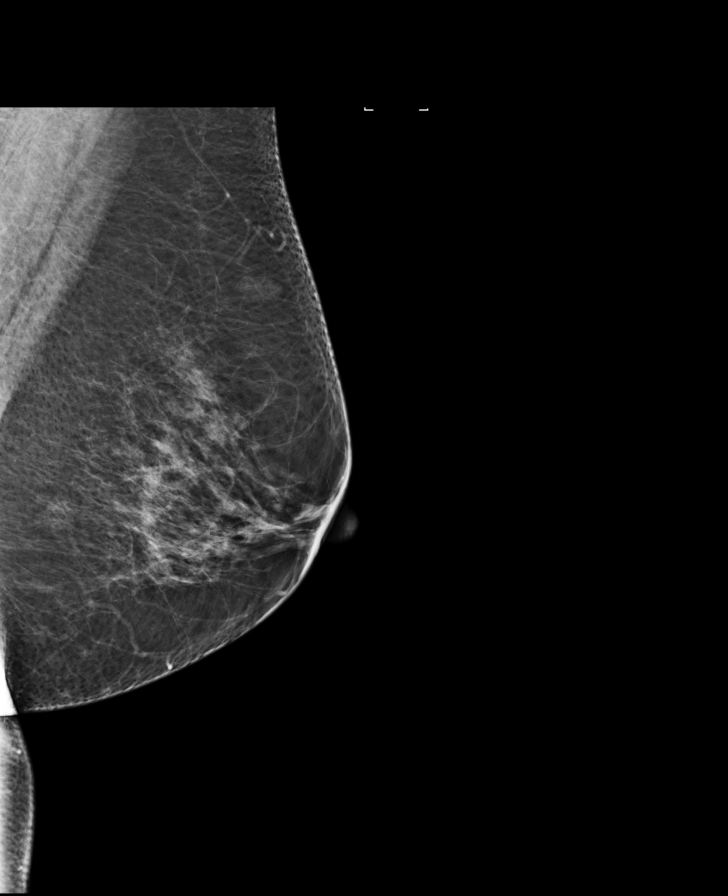

[R CC]
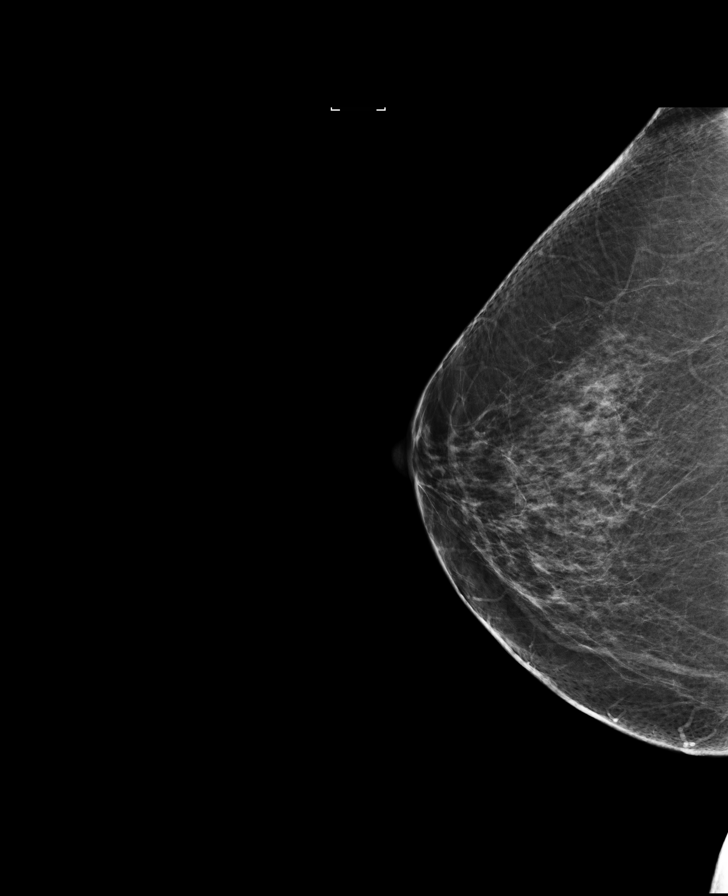

[L CC]
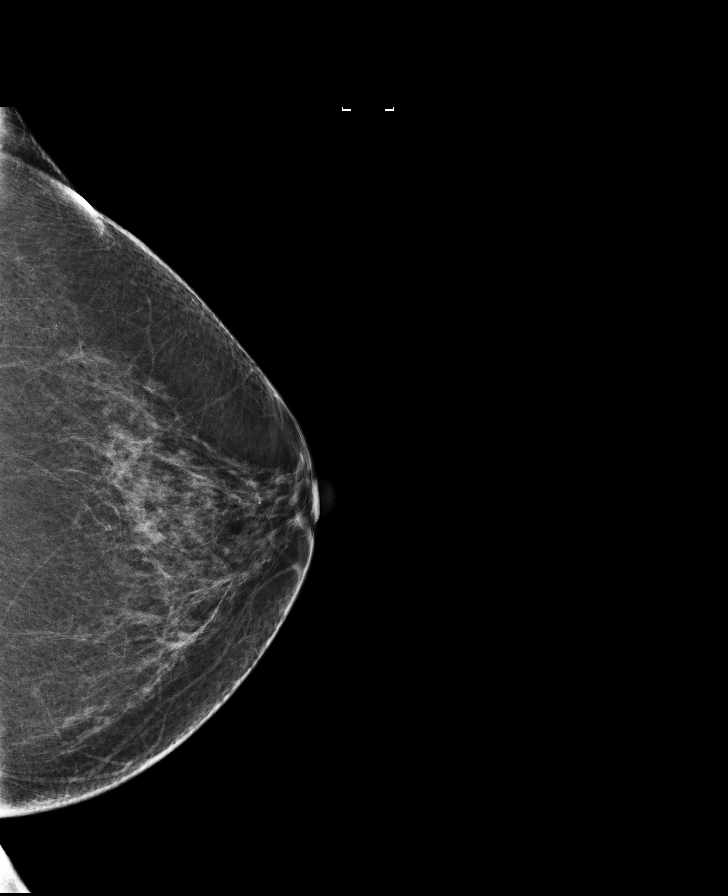

[R CC synth-2D]
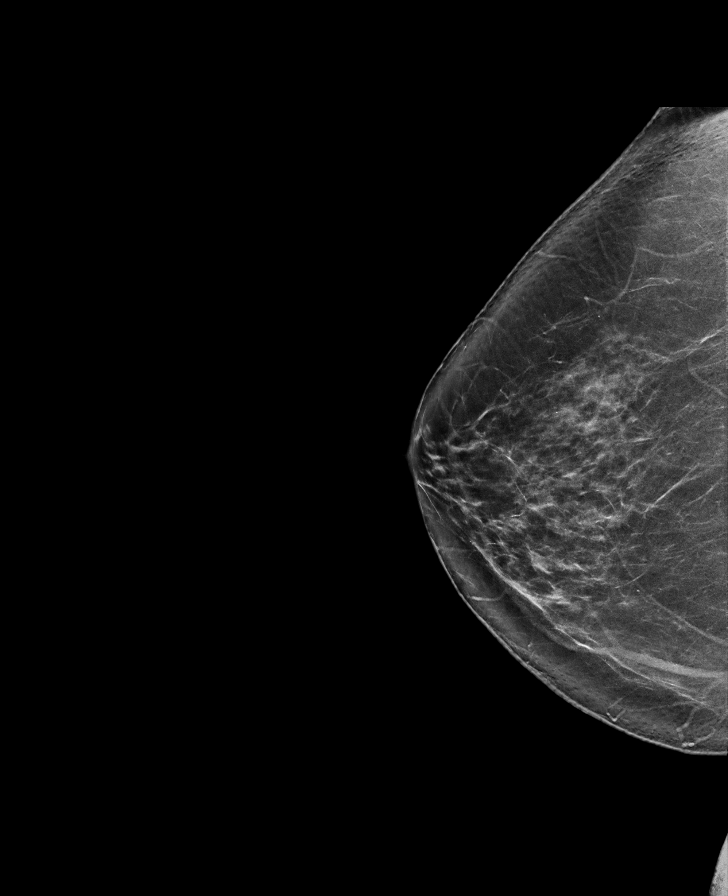

[8 of 28 positions shown; findings below may reference images not displayed]

ACR Breast Density Category c: The breast tissue is heterogeneously
dense, which may obscure small masses.
FINDINGS: There are no findings suspicious for malignancy. Images were
processed with CAD.
IMPRESSION: No mammographic evidence of malignancy. A result letter of this
screening mammogram will be mailed directly to the patient.

RECOMMENDATION:
Screening mammogram in one year. (Code:[id number])

BI-RADS CATEGORY  1: Negative.

## 2017-08-05 DIAGNOSIS — H2513 Age-related nuclear cataract, bilateral: Secondary | ICD-10-CM | POA: Diagnosis not present

## 2017-09-03 DIAGNOSIS — R69 Illness, unspecified: Secondary | ICD-10-CM | POA: Diagnosis not present

## 2017-10-07 DIAGNOSIS — Z86018 Personal history of other benign neoplasm: Secondary | ICD-10-CM | POA: Diagnosis not present

## 2017-10-07 DIAGNOSIS — Z23 Encounter for immunization: Secondary | ICD-10-CM | POA: Diagnosis not present

## 2017-10-07 DIAGNOSIS — Z85828 Personal history of other malignant neoplasm of skin: Secondary | ICD-10-CM | POA: Diagnosis not present

## 2017-10-07 DIAGNOSIS — L570 Actinic keratosis: Secondary | ICD-10-CM | POA: Diagnosis not present

## 2017-10-07 DIAGNOSIS — D239 Other benign neoplasm of skin, unspecified: Secondary | ICD-10-CM | POA: Diagnosis not present

## 2017-10-07 DIAGNOSIS — M67442 Ganglion, left hand: Secondary | ICD-10-CM | POA: Diagnosis not present

## 2017-10-07 DIAGNOSIS — D225 Melanocytic nevi of trunk: Secondary | ICD-10-CM | POA: Diagnosis not present

## 2017-10-07 DIAGNOSIS — L821 Other seborrheic keratosis: Secondary | ICD-10-CM | POA: Diagnosis not present

## 2017-11-29 ENCOUNTER — Telehealth: Payer: Self-pay

## 2017-11-29 NOTE — Telephone Encounter (Signed)
Copied from [place] [id number]. Topic: Quick Communication - Office Called Patient >> Nov 28, 2017  4:09 PM Pilar Grammes, CMA wrote: Reason for CRM: Called pt to verify whether she was taking estradiol anymore. We received a PA request for it but her last OV note in April 2018 said she was to wean off of it.   PEC, please ask if she is taking the medication. If she is, how often is she taking it? >> Nov 29, 2017 12:29 PM Darl Householder, RMA wrote: Patient called back and confirmed that she is taking Estradiol and she is taking it 3 times a week, pt would like at call back at [phone]

## 2017-12-02 NOTE — Telephone Encounter (Signed)
Form completed on CoverMyMeds. Can take up to 2 weeks per [facility] for a response. Should be faxed to the main fax number.

## 2017-12-04 NOTE — Telephone Encounter (Signed)
Approval received from [facility] for coverage until 11-11-18.

## 2018-02-16 ENCOUNTER — Other Ambulatory Visit: Payer: Self-pay | Admitting: Internal Medicine

## 2018-02-25 ENCOUNTER — Ambulatory Visit (INDEPENDENT_AMBULATORY_CARE_PROVIDER_SITE_OTHER): Payer: Medicare HMO | Admitting: Internal Medicine

## 2018-02-25 ENCOUNTER — Encounter: Payer: Self-pay | Admitting: Internal Medicine

## 2018-02-25 VITALS — BP 122/64 | HR 78 | Temp 97.8°F | Ht 66.75 in | Wt 175.5 lb

## 2018-02-25 DIAGNOSIS — I872 Venous insufficiency (chronic) (peripheral): Secondary | ICD-10-CM

## 2018-02-25 DIAGNOSIS — Z23 Encounter for immunization: Secondary | ICD-10-CM

## 2018-02-25 DIAGNOSIS — Z0000 Encounter for general adult medical examination without abnormal findings: Secondary | ICD-10-CM | POA: Diagnosis not present

## 2018-02-25 DIAGNOSIS — M150 Primary generalized (osteo)arthritis: Secondary | ICD-10-CM

## 2018-02-25 DIAGNOSIS — M159 Polyosteoarthritis, unspecified: Secondary | ICD-10-CM

## 2018-02-25 DIAGNOSIS — E785 Hyperlipidemia, unspecified: Secondary | ICD-10-CM

## 2018-02-25 DIAGNOSIS — N951 Menopausal and female climacteric states: Secondary | ICD-10-CM | POA: Diagnosis not present

## 2018-02-25 DIAGNOSIS — Z7189 Other specified counseling: Secondary | ICD-10-CM

## 2018-02-25 DIAGNOSIS — M8949 Other hypertrophic osteoarthropathy, multiple sites: Secondary | ICD-10-CM

## 2018-02-25 LAB — COMPREHENSIVE METABOLIC PANEL
ALBUMIN: 4 g/dL (ref 3.5–5.2)
ALK PHOS: 65 U/L (ref 39–117)
ALT: 17 U/L (ref 0–35)
AST: 22 U/L (ref 0–37)
BILIRUBIN TOTAL: 0.7 mg/dL (ref 0.2–1.2)
BUN: 25 mg/dL — ABNORMAL HIGH (ref 6–23)
CALCIUM: 9.5 mg/dL (ref 8.4–10.5)
CO2: 28 mEq/L (ref 19–32)
Chloride: 100 mEq/L (ref 96–112)
Creatinine, Ser: 0.98 mg/dL (ref 0.40–1.20)
GFR: 59.29 mL/min — AB (ref >60.00)
Glucose, Bld: 96 mg/dL (ref 70–99)
Potassium: 3.9 mEq/L (ref 3.5–5.1)
Sodium: 136 mEq/L (ref 135–145)
TOTAL PROTEIN: 7.1 g/dL (ref 6.0–8.3)

## 2018-02-25 LAB — CBC
HEMATOCRIT: 44.8 % (ref 36.0–46.0)
HEMOGLOBIN: 15.3 g/dL — AB (ref 12.0–15.0)
MCHC: 34.1 g/dL (ref 30.0–36.0)
MCV: 94.3 fl (ref 78.0–100.0)
Platelets: 275 10*3/uL (ref 150.0–400.0)
RBC: 4.75 Mil/uL (ref 3.87–5.11)
RDW: 13.4 % (ref 11.5–15.5)
WBC: 4.5 10*3/uL (ref 4.0–10.5)

## 2018-02-25 LAB — LIPID PANEL
CHOLESTEROL: 285 mg/dL — AB (ref 0–200)
HDL: 45.3 mg/dL (ref >39.00)
NonHDL: 239.42
TRIGLYCERIDES: 396 mg/dL — AB (ref 0.0–149.0)
Total CHOL/HDL Ratio: 6
VLDL: 79.2 mg/dL — ABNORMAL HIGH (ref 0.0–40.0)

## 2018-02-25 LAB — LDL CHOLESTEROL, DIRECT: LDL DIRECT: 149 mg/dL

## 2018-02-25 NOTE — Assessment & Plan Note (Signed)
Controlled with low dose estrogen Discussed weaning further if tolerated

## 2018-02-25 NOTE — Assessment & Plan Note (Signed)
See social history 

## 2018-02-25 NOTE — Assessment & Plan Note (Signed)
Does okay with tylenol Discussed using ibuprofen prn if having a bad day (thinks that is actually more effective)

## 2018-02-25 NOTE — Assessment & Plan Note (Signed)
Does okay with diuretic and support hose Will check labs

## 2018-02-25 NOTE — Addendum Note (Signed)
Addended by: Pilar Grammes on: 02/25/2018 10:51 AM   Modules accepted: Orders

## 2018-02-25 NOTE — Progress Notes (Signed)
Subjective:    Patient ID: Sierra Salinas, female    DOB: 13-May-1946, 72 y.o.   MRN: [id number]  HPI Here for Medicare wellness and follow up of chronic health conditions Reviewed form and advanced directives Reviewed other doctors No alcohol or tobacco No exercise in many months---discussed restarting No falls Vision is fine with glasses.  Hearing is fairly good. Some trouble in a crowded room No depression or anhedonia (if she has time to do things) Independent with instrumental ADLs No sig memory issues  Continues to caregive for long term companion Stressful year with him---hospitalizations (goes to [place] so travel issues also)  Continues on the diuretic Wears compression hose Does okay in winter--more trouble in hot weather No chest pain No SOB Occasional palpitations with anxiety No dizziness or syncope  Taking 3 estrogen tabs weekly No recent weaning attempt--discussed  Ongoing sleep problems Initiates fine but awakens around 3AM and can't get back to sleep Does take 5mg  of melatonin---hard to tell if it helps  Some arthritic pain Only taking tylenol for now Left knee especially painful coming down steps  Reviewed elevated cholesterol Dad did die of MI at 44-but was heavy smoker She would consider Rx  Current Outpatient Medications on File Prior to Visit  Medication Sig Dispense Refill  . acetaminophen (TYLENOL) 325 MG tablet Take 650 mg by mouth every 6 (six) hours as needed.    . calcium citrate-vitamin D 200-200 MG-UNIT TABS Take 1 tablet by mouth daily.      Marland Kitchen estradiol (ESTRACE) 0.5 MG tablet Take 1 tablet (0.5 mg total) by mouth daily. (Patient taking differently: Take 0.5 mg by mouth every other day. ) 30 tablet 11  . Melatonin 1 MG TABS Take 1-3 mg by mouth at bedtime as needed.      . NON FORMULARY vitafusion    . triamterene-hydrochlorothiazide (DYAZIDE) 37.5-25 MG capsule TAKE 1 EACH (1 CAPSULE TOTAL) BY MOUTH DAILY. 90 capsule 0   No current  facility-administered medications on file prior to visit.     Allergies  Allergen Reactions  . Nitrofurantoin     Pt denies    Past Medical History:  Diagnosis Date  . Allergy   . Cystitis    recurrent  . Hyperlipidemia   . Osteoarthritis of multiple joints   . Osteopenia   . 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 Unspecified venous (peripheral) insufficiency     Past Surgical History:  Procedure Laterality Date  . ABDOMINAL HYSTERECTOMY    . COLONOSCOPY    . NASAL SEPTUM SURGERY      Family History  Problem Relation Age of Onset  . Arthritis Mother   . Hypertension Mother   . Ovarian cancer Maternal Aunt 80  . Stroke Maternal Grandmother   . Colon cancer Maternal Aunt 67    Social History   Socioeconomic History  . Marital status: Single    Spouse name: Not on file  . Number of children: 2  . Years of education: Not on file  . Highest education level: Not on file  Occupational History  . Occupation: Associate Professor: [employer]  Social Needs  . Financial resource strain: Not on file  . Food insecurity:    Worry: Not on file    Inability: Not on file  . Transportation needs:    Medical: Not on file    Non-medical: Not on file  Tobacco Use  . Smoking status: Former Research scientist (life sciences)  .  Smokeless tobacco: Never Used  Substance and Sexual Activity  . Alcohol use: Yes    Alcohol/week: 0.0 oz    Comment: occassional  . Drug use: No  . Sexual activity: Not on file  Lifestyle  . Physical activity:    Days per week: Not on file    Minutes per session: Not on file  . Stress: Not on file  Relationships  . Social connections:    Talks on phone: Not on file    Gets together: Not on file    Attends religious service: Not on file    Active member of club or organization: Not on file    Attends meetings of clubs or organizations: Not on file    Relationship status: Not on file  . Intimate partner violence:    Fear of current or ex partner: Not  on file    Emotionally abused: Not on file    Physically abused: Not on file    Forced sexual activity: Not on file  Other Topics Concern  . Not on file  Social History Narrative   Mostly caregives for long term companion   Has living will   Has designated daughter [name] to be health care POA   Would accept resuscitation attempts---no prolonged artificial ventilation   Would accept feeding tube for limited time depending on circumstances   Review of Systems Appetite is fine Weight down slightly Wears seat belt Some issues with teeth--needed extraction and will get bridge made No heartburn or dysphagia Bowels fine. No bleeding No skin lesions of concern---thin skin on dorsum of hands, very sensitive Voids fine. No allergy problems    Objective:   Physical Exam  Constitutional: She is oriented to person, place, and time. She appears well-developed. No distress.  HENT:  Mouth/Throat: Oropharynx is clear and moist. No oropharyngeal exudate.  Neck: No thyromegaly present.  Cardiovascular: Normal rate, regular rhythm, normal heart sounds and intact distal pulses. Exam reveals no gallop.  No murmur heard. Pulmonary/Chest: Effort normal and breath sounds normal. No respiratory distress. She has no wheezes. She has no rales.  Abdominal: Soft. There is no tenderness.  Musculoskeletal: She exhibits no edema or tenderness.  Lymphadenopathy:    She has no cervical adenopathy.  Neurological: She is alert and oriented to person, place, and time.  President--- "Milinda Pointer, Obama, Bush" 100-93- (bad with numbers) D-l-r-o-w Recall 3/3  Skin: No rash noted. No erythema.  Psychiatric: She has a normal mood and affect. Her behavior is normal.          Assessment & Plan:

## 2018-02-25 NOTE — Assessment & Plan Note (Signed)
I have personally reviewed the Medicare Annual Wellness questionnaire and have noted 1. The patient's medical and social history 2. Their use of alcohol, tobacco or illicit drugs 3. Their current medications and supplements 4. The patient's functional ability including ADL's, fall risks, home safety risks and hearing or visual             impairment. 5. Diet and physical activities 6. Evidence for depression or mood disorders  The patients weight, height, BMI and visual acuity have been recorded in the chart I have made referrals, counseling and provided education to the patient based review of the above and I have provided the pt with a written personalized care plan for preventive services.  I have provided you with a copy of your personalized plan for preventive services. Please take the time to review along with your updated medication list.  Yearly flu vaccine Yearly or every 2 years for mammogram Should be done with colons No pap due to age Discussed fitness

## 2018-02-25 NOTE — Assessment & Plan Note (Signed)
Discussed Rx If LDL over 190, she will consider statin

## 2018-04-09 ENCOUNTER — Other Ambulatory Visit: Payer: Self-pay | Admitting: Internal Medicine

## 2018-04-09 DIAGNOSIS — Z1231 Encounter for screening mammogram for malignant neoplasm of breast: Secondary | ICD-10-CM

## 2018-04-23 ENCOUNTER — Ambulatory Visit
Admission: RE | Admit: 2018-04-23 | Discharge: 2018-04-23 | Disposition: A | Discharge status: Home / Self Care | Payer: Medicare HMO | Source: Ambulatory Visit | Attending: Internal Medicine | Admitting: Internal Medicine

## 2018-04-23 DIAGNOSIS — Z1231 Encounter for screening mammogram for malignant neoplasm of breast: Secondary | ICD-10-CM | POA: Insufficient documentation

## 2018-05-16 ENCOUNTER — Other Ambulatory Visit: Payer: Self-pay | Admitting: Internal Medicine

## 2018-05-21 ENCOUNTER — Other Ambulatory Visit: Payer: Self-pay | Admitting: Internal Medicine

## 2018-07-01 DIAGNOSIS — L739 Follicular disorder, unspecified: Secondary | ICD-10-CM | POA: Diagnosis not present

## 2018-07-01 DIAGNOSIS — D485 Neoplasm of uncertain behavior of skin: Secondary | ICD-10-CM | POA: Diagnosis not present

## 2018-07-01 DIAGNOSIS — L821 Other seborrheic keratosis: Secondary | ICD-10-CM | POA: Diagnosis not present

## 2018-07-22 DIAGNOSIS — H2513 Age-related nuclear cataract, bilateral: Secondary | ICD-10-CM | POA: Diagnosis not present

## 2018-10-22 DIAGNOSIS — L814 Other melanin hyperpigmentation: Secondary | ICD-10-CM | POA: Diagnosis not present

## 2018-10-22 DIAGNOSIS — D225 Melanocytic nevi of trunk: Secondary | ICD-10-CM | POA: Diagnosis not present

## 2018-10-22 DIAGNOSIS — Z85828 Personal history of other malignant neoplasm of skin: Secondary | ICD-10-CM | POA: Diagnosis not present

## 2018-10-22 DIAGNOSIS — L821 Other seborrheic keratosis: Secondary | ICD-10-CM | POA: Diagnosis not present

## 2018-10-22 DIAGNOSIS — L2084 Intrinsic (allergic) eczema: Secondary | ICD-10-CM | POA: Diagnosis not present

## 2018-10-22 DIAGNOSIS — Z23 Encounter for immunization: Secondary | ICD-10-CM | POA: Diagnosis not present

## 2018-10-22 DIAGNOSIS — Z86018 Personal history of other benign neoplasm: Secondary | ICD-10-CM | POA: Diagnosis not present

## 2018-11-06 ENCOUNTER — Ambulatory Visit: Payer: Medicare HMO

## 2018-11-16 ENCOUNTER — Other Ambulatory Visit: Payer: Self-pay | Admitting: Internal Medicine

## 2018-11-21 DIAGNOSIS — R69 Illness, unspecified: Secondary | ICD-10-CM | POA: Diagnosis not present

## 2019-03-03 DIAGNOSIS — L700 Acne vulgaris: Secondary | ICD-10-CM | POA: Diagnosis not present

## 2019-03-04 ENCOUNTER — Encounter: Payer: Medicare HMO | Admitting: Internal Medicine

## 2019-05-08 ENCOUNTER — Other Ambulatory Visit: Payer: Self-pay | Admitting: Internal Medicine

## 2019-05-10 ENCOUNTER — Other Ambulatory Visit: Payer: Self-pay | Admitting: Internal Medicine

## 2019-05-21 DIAGNOSIS — L570 Actinic keratosis: Secondary | ICD-10-CM | POA: Diagnosis not present

## 2019-05-21 DIAGNOSIS — D485 Neoplasm of uncertain behavior of skin: Secondary | ICD-10-CM | POA: Diagnosis not present

## 2019-06-04 DIAGNOSIS — R69 Illness, unspecified: Secondary | ICD-10-CM | POA: Diagnosis not present

## 2019-07-28 DIAGNOSIS — Z0100 Encounter for examination of eyes and vision without abnormal findings: Secondary | ICD-10-CM | POA: Diagnosis not present

## 2019-07-28 DIAGNOSIS — H2513 Age-related nuclear cataract, bilateral: Secondary | ICD-10-CM | POA: Diagnosis not present

## 2019-07-28 DIAGNOSIS — H1851 Endothelial corneal dystrophy: Secondary | ICD-10-CM | POA: Diagnosis not present

## 2019-07-28 DIAGNOSIS — H5203 Hypermetropia, bilateral: Secondary | ICD-10-CM | POA: Diagnosis not present

## 2019-07-28 DIAGNOSIS — H52203 Unspecified astigmatism, bilateral: Secondary | ICD-10-CM | POA: Diagnosis not present

## 2019-08-07 ENCOUNTER — Encounter: Payer: Self-pay | Admitting: Internal Medicine

## 2019-08-07 ENCOUNTER — Ambulatory Visit (INDEPENDENT_AMBULATORY_CARE_PROVIDER_SITE_OTHER): Payer: Medicare HMO | Admitting: Internal Medicine

## 2019-08-07 ENCOUNTER — Other Ambulatory Visit: Payer: Self-pay

## 2019-08-07 VITALS — BP 118/84 | HR 71 | Temp 97.3°F | Ht 66.75 in | Wt 178.0 lb

## 2019-08-07 DIAGNOSIS — I872 Venous insufficiency (chronic) (peripheral): Secondary | ICD-10-CM

## 2019-08-07 DIAGNOSIS — Z23 Encounter for immunization: Secondary | ICD-10-CM | POA: Diagnosis not present

## 2019-08-07 DIAGNOSIS — M150 Primary generalized (osteo)arthritis: Secondary | ICD-10-CM | POA: Diagnosis not present

## 2019-08-07 DIAGNOSIS — Z0000 Encounter for general adult medical examination without abnormal findings: Secondary | ICD-10-CM

## 2019-08-07 DIAGNOSIS — N951 Menopausal and female climacteric states: Secondary | ICD-10-CM

## 2019-08-07 DIAGNOSIS — Z7189 Other specified counseling: Secondary | ICD-10-CM | POA: Diagnosis not present

## 2019-08-07 DIAGNOSIS — G479 Sleep disorder, unspecified: Secondary | ICD-10-CM

## 2019-08-07 DIAGNOSIS — E785 Hyperlipidemia, unspecified: Secondary | ICD-10-CM | POA: Diagnosis not present

## 2019-08-07 DIAGNOSIS — M159 Polyosteoarthritis, unspecified: Secondary | ICD-10-CM

## 2019-08-07 DIAGNOSIS — M8949 Other hypertrophic osteoarthropathy, multiple sites: Secondary | ICD-10-CM

## 2019-08-07 LAB — CBC
HCT: 46.3 % — ABNORMAL HIGH (ref 36.0–46.0)
Hemoglobin: 15.6 g/dL — ABNORMAL HIGH (ref 12.0–15.0)
MCHC: 33.6 g/dL (ref 30.0–36.0)
MCV: 93.2 fl (ref 78.0–100.0)
Platelets: 285 10*3/uL (ref 150.0–400.0)
RBC: 4.97 Mil/uL (ref 3.87–5.11)
RDW: 13.2 % (ref 11.5–15.5)
WBC: 5.4 10*3/uL (ref 4.0–10.5)

## 2019-08-07 LAB — COMPREHENSIVE METABOLIC PANEL
ALT: 13 U/L (ref 0–35)
AST: 17 U/L (ref 0–37)
Albumin: 4.1 g/dL (ref 3.5–5.2)
Alkaline Phosphatase: 67 U/L (ref 39–117)
BUN: 17 mg/dL (ref 6–23)
CO2: 29 mEq/L (ref 19–32)
Calcium: 9.9 mg/dL (ref 8.4–10.5)
Chloride: 100 mEq/L (ref 96–112)
Creatinine, Ser: 0.98 mg/dL (ref 0.40–1.20)
GFR: 55.56 mL/min — ABNORMAL LOW (ref >60.00)
Glucose, Bld: 95 mg/dL (ref 70–99)
Potassium: 3.8 mEq/L (ref 3.5–5.1)
Sodium: 137 mEq/L (ref 135–145)
Total Bilirubin: 0.8 mg/dL (ref 0.2–1.2)
Total Protein: 7.2 g/dL (ref 6.0–8.3)

## 2019-08-07 LAB — LIPID PANEL
Cholesterol: 265 mg/dL — ABNORMAL HIGH (ref 0–200)
HDL: 43.2 mg/dL (ref >39.00)
NonHDL: 221.3
Total CHOL/HDL Ratio: 6
Triglycerides: 382 mg/dL — ABNORMAL HIGH (ref 0.0–149.0)
VLDL: 76.4 mg/dL — ABNORMAL HIGH (ref 0.0–40.0)

## 2019-08-07 LAB — LDL CHOLESTEROL, DIRECT: Direct LDL: 146 mg/dL

## 2019-08-07 MED ORDER — ESTRADIOL 0.5 MG PO TABS: 0.5000 mg | ORAL_TABLET | ORAL | 0 refills | Status: DC

## 2019-08-07 NOTE — Assessment & Plan Note (Signed)
See social history 

## 2019-08-07 NOTE — Assessment & Plan Note (Signed)
Does fine with diuretic and support hose

## 2019-08-07 NOTE — Assessment & Plan Note (Signed)
Moderate Not at threshold to press for statin use

## 2019-08-07 NOTE — Assessment & Plan Note (Signed)
Sleeping better with benedryl and melatonin Discussed using the least benedryl possible

## 2019-08-07 NOTE — Progress Notes (Signed)
Subjective:    Patient ID: Sierra Salinas, female    DOB: 02/12/1946, 73 y.o.   MRN: [id number]  HPI Here for Medicare wellness visit and follow up of chronic health conditions Reviewed form and advanced directives Reviewed other doctors No tobacco Rare drink of alcohol Still no exercise--discussed Vision is fine Mild hearing issues---if there is background noise No falls No regular depression---just some grieving. Not anhedonic Independent with instrumental ADLs Memory seems fine  Was able to wean the estrogen down to twice a week No symptoms on this  Reviewed her cholesterol Not excited about statin  Using melatonin 5mg  and benedryl nightly This is working  [employer] on diuretic Still gets some swelling---compression hose do help as well No pain  Mild arthritis pain--mostly knees when coming down steps Uses tylenol in the morning Right second finger will trigger at times---does stay like that  Current Outpatient Medications on File Prior to Visit  Medication Sig Dispense Refill  . acetaminophen (TYLENOL) 500 MG tablet Take 1,000 mg by mouth every 6 (six) hours as needed.    . calcium citrate-vitamin D 200-200 MG-UNIT TABS Take 1 tablet by mouth daily.      . diphenhydrAMINE (BENADRYL) 25 MG tablet Take 25 mg by mouth at bedtime as needed.    Marland Kitchen estradiol (ESTRACE) 0.5 MG tablet TAKE 1 TABLET BY MOUTH EVERY DAY 90 tablet 0  . Melatonin 5 MG CAPS Take 5 mg by mouth at bedtime.    . NON FORMULARY vitafusion    . triamterene-hydrochlorothiazide (DYAZIDE) 37.5-25 MG capsule TAKE 1 EACH (1 CAPSULE TOTAL) BY MOUTH DAILY. 90 capsule 3   No current facility-administered medications on file prior to visit.     Allergies  Allergen Reactions  . Nitrofurantoin     Pt denies    Past Medical History:  Diagnosis Date  . Allergy   . Cystitis    recurrent  . Hyperlipidemia   . Osteoarthritis of multiple joints   . Osteopenia   . 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 Unspecified venous (peripheral) insufficiency     Past Surgical History:  Procedure Laterality Date  . ABDOMINAL HYSTERECTOMY    . COLONOSCOPY    . NASAL SEPTUM SURGERY      Family History  Problem Relation Age of Onset  . Arthritis Mother   . Hypertension Mother   . Ovarian cancer Maternal Aunt 80  . Stroke Maternal Grandmother   . Colon cancer Maternal Aunt 67  . Breast cancer Neg Hx     Social History   Socioeconomic History  . Marital status: Divorced    Spouse name: Not on file  . Number of children: 2  . Years of education: Not on file  . Highest education level: Not on file  Occupational History  . 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 Financial resource strain: Not on file  . Food insecurity    Worry: Not on file    Inability: Not on file  . Transportation needs    Medical: Not on file    Non-medical: Not on file  Tobacco Use  . Smoking status: Former Research scientist (life sciences)  . Smokeless tobacco: Never Used  Substance and Sexual Activity  . Alcohol use: Yes    Alcohol/week: 0.0 standard drinks    Comment: occassional  . Drug use: No  . Sexual activity: Not on file  Lifestyle  . Physical activity    Days  per week: Not on file    Minutes per session: Not on file  . Stress: Not on file  Relationships  . Social Herbalist on phone: Not on file    Gets together: Not on file    Attends religious service: Not on file    Active member of club or organization: Not on file    Attends meetings of clubs or organizations: Not on file    Relationship status: Not on file  . Intimate partner violence    Fear of current or ex partner: Not on file    Emotionally abused: Not on file    Physically abused: Not on file    Forced sexual activity: Not on file  Other Topics Concern  . Not on file  Social History Narrative   Mostly caregives for long term companion   Has living will   Has designated daughter Sierra Salinas to be health care  POA   Would accept resuscitation attempts---no prolonged artificial ventilation   Would accept feeding tube for limited time depending on circumstances   Review of Systems Appetite is good Weight is stable Wears seat belt Teeth are fine--sees dentist No suspicious skin lesions--does keep up with derm No chest pain or palpitations No cough or SOB No dizziness or syncope No heartburn. Does note occasional feeling that drinks "go down the wrong pipe" if she is not careful Bowels are fine. No blood     Objective:   Physical Exam  Constitutional: She is oriented to person, place, and time. She appears well-developed. No distress.  HENT:  Mouth/Throat: Oropharynx is clear and moist. No oropharyngeal exudate.  Neck: No thyromegaly present.  Cardiovascular: Normal rate, regular rhythm, normal heart sounds and intact distal pulses. Exam reveals no gallop.  No murmur heard. Respiratory: Effort normal and breath sounds normal. No respiratory distress. She has no wheezes. She has no rales.  GI: Soft. There is no abdominal tenderness.  Musculoskeletal:        General: No tenderness or edema.  Lymphadenopathy:    She has no cervical adenopathy.  Neurological: She is alert and oriented to person, place, and time.  President-- "[name]" [phone] D-l-r-o-w Recall 3/3  Skin: No rash noted. No erythema.  Psychiatric: She has a normal mood and affect. Her behavior is normal.           Assessment & Plan:

## 2019-08-07 NOTE — Addendum Note (Signed)
Addended by: Pilar Grammes on: 08/07/2019 11:21 AM   Modules accepted: Orders

## 2019-08-07 NOTE — Assessment & Plan Note (Signed)
I have personally reviewed the Medicare Annual Wellness questionnaire and have noted 1. The patient's medical and social history 2. Their use of alcohol, tobacco or illicit drugs 3. Their current medications and supplements 4. The patient's functional ability including ADL's, fall risks, home safety risks and hearing or visual             impairment. 5. Diet and physical activities 6. Evidence for depression or mood disorders  The patients weight, height, BMI and visual acuity have been recorded in the chart I have made referrals, counseling and provided education to the patient based review of the above and I have provided the pt with a written personalized care plan for preventive services.  I have provided you with a copy of your personalized plan for preventive services. Please take the time to review along with your updated medication list.  Due for mammogram Normal colon 2017--done with screening Discussed fitness Flu vaccine today Never had chicken pox--- strongly recommended shingrix at pharmacy

## 2019-08-07 NOTE — Progress Notes (Signed)
Hearing Screening   Method: Audiometry   125Hz 250Hz 500Hz 1000Hz 2000Hz 3000Hz 4000Hz 6000Hz 8000Hz  Right ear:   20 20 20  20    Left ear:   20 20 20  20    Vision Screening Comments: September 2020   

## 2019-08-07 NOTE — Assessment & Plan Note (Signed)
Has gone down to 2 days per week

## 2019-08-07 NOTE — Assessment & Plan Note (Signed)
Mild Uses tylenol every morning

## 2019-08-10 ENCOUNTER — Other Ambulatory Visit: Payer: Self-pay | Admitting: Internal Medicine

## 2019-08-10 DIAGNOSIS — Z1231 Encounter for screening mammogram for malignant neoplasm of breast: Secondary | ICD-10-CM

## 2019-09-11 ENCOUNTER — Ambulatory Visit
Admission: RE | Admit: 2019-09-11 | Discharge: 2019-09-11 | Disposition: A | Discharge status: Home / Self Care | Payer: Medicare HMO | Source: Ambulatory Visit | Attending: Internal Medicine | Admitting: Internal Medicine

## 2019-09-11 DIAGNOSIS — Z1231 Encounter for screening mammogram for malignant neoplasm of breast: Secondary | ICD-10-CM | POA: Insufficient documentation

## 2019-09-14 ENCOUNTER — Other Ambulatory Visit: Payer: Self-pay | Admitting: Internal Medicine

## 2019-09-14 DIAGNOSIS — N6320 Unspecified lump in the left breast, unspecified quadrant: Secondary | ICD-10-CM

## 2019-09-14 DIAGNOSIS — R928 Other abnormal and inconclusive findings on diagnostic imaging of breast: Secondary | ICD-10-CM

## 2019-09-29 ENCOUNTER — Ambulatory Visit
Admission: RE | Admit: 2019-09-29 | Discharge: 2019-09-29 | Disposition: A | Discharge status: Home / Self Care | Payer: Medicare HMO | Source: Ambulatory Visit | Attending: Internal Medicine | Admitting: Internal Medicine

## 2019-09-29 DIAGNOSIS — R928 Other abnormal and inconclusive findings on diagnostic imaging of breast: Secondary | ICD-10-CM | POA: Insufficient documentation

## 2019-09-29 DIAGNOSIS — N6320 Unspecified lump in the left breast, unspecified quadrant: Secondary | ICD-10-CM

## 2019-09-29 DIAGNOSIS — N6012 Diffuse cystic mastopathy of left breast: Secondary | ICD-10-CM | POA: Diagnosis not present

## 2019-11-30 DIAGNOSIS — L820 Inflamed seborrheic keratosis: Secondary | ICD-10-CM | POA: Diagnosis not present

## 2019-11-30 DIAGNOSIS — D225 Melanocytic nevi of trunk: Secondary | ICD-10-CM | POA: Diagnosis not present

## 2019-11-30 DIAGNOSIS — Z86018 Personal history of other benign neoplasm: Secondary | ICD-10-CM | POA: Diagnosis not present

## 2019-11-30 DIAGNOSIS — L578 Other skin changes due to chronic exposure to nonionizing radiation: Secondary | ICD-10-CM | POA: Diagnosis not present

## 2019-11-30 DIAGNOSIS — Z85828 Personal history of other malignant neoplasm of skin: Secondary | ICD-10-CM | POA: Diagnosis not present

## 2019-11-30 DIAGNOSIS — L814 Other melanin hyperpigmentation: Secondary | ICD-10-CM | POA: Diagnosis not present

## 2019-11-30 DIAGNOSIS — L821 Other seborrheic keratosis: Secondary | ICD-10-CM | POA: Diagnosis not present

## 2019-11-30 DIAGNOSIS — L570 Actinic keratosis: Secondary | ICD-10-CM | POA: Diagnosis not present

## 2020-01-07 ENCOUNTER — Ambulatory Visit: Payer: Medicare HMO | Attending: Internal Medicine

## 2020-01-07 DIAGNOSIS — Z23 Encounter for immunization: Secondary | ICD-10-CM | POA: Insufficient documentation

## 2020-01-07 NOTE — Progress Notes (Signed)
   Covid-19 Vaccination Clinic  Name:  Sierra Salinas    MRN: [id number] DOB: 12-30-1945  01/07/2020  Sierra Salinas was observed post Covid-19 immunization for 15 minutes without incidence. She was provided with Vaccine Information Sheet and instruction to access the V-Safe system.   Sierra Salinas was instructed to call 911 with any severe reactions post vaccine: Marland Kitchen Difficulty breathing  . Swelling of your face and throat  . A fast heartbeat  . A bad rash all over your body  . Dizziness and weakness    Immunizations Administered    Name Date Dose VIS Date Route   Pfizer COVID-19 Vaccine 01/07/2020  3:34 PM 0.3 mL 10/23/2019 Intramuscular   Manufacturer: [place]   Lot: [id number]   [place]: [id number]

## 2020-02-02 ENCOUNTER — Ambulatory Visit: Payer: Medicare HMO | Attending: Internal Medicine

## 2020-02-02 DIAGNOSIS — Z23 Encounter for immunization: Secondary | ICD-10-CM

## 2020-02-02 NOTE — Progress Notes (Signed)
   Covid-19 Vaccination Clinic  Name:  Sierra Salinas    MRN: [id number] DOB: 1946-03-22  02/02/2020  Sierra Salinas was observed post Covid-19 immunization for 15 minutes without incident. She was provided with Vaccine Information Sheet and instruction to access the V-Safe system.   Sierra Salinas was instructed to call 911 with any severe reactions post vaccine: Marland Kitchen Difficulty breathing  . Swelling of face and throat  . A fast heartbeat  . A bad rash all over body  . Dizziness and weakness   Immunizations Administered    Name Date Dose VIS Date Route   Pfizer COVID-19 Vaccine 02/02/2020  3:13 PM 0.3 mL 10/23/2019 Intramuscular   Manufacturer: [place]   Lot: [id number]   [place]: [id number]

## 2020-04-07 DIAGNOSIS — R69 Illness, unspecified: Secondary | ICD-10-CM | POA: Diagnosis not present

## 2020-05-05 ENCOUNTER — Other Ambulatory Visit: Payer: Self-pay | Admitting: Internal Medicine

## 2020-08-09 ENCOUNTER — Other Ambulatory Visit: Payer: Self-pay

## 2020-08-09 ENCOUNTER — Encounter: Payer: Self-pay | Admitting: Internal Medicine

## 2020-08-09 ENCOUNTER — Ambulatory Visit (INDEPENDENT_AMBULATORY_CARE_PROVIDER_SITE_OTHER): Payer: Medicare HMO | Admitting: Internal Medicine

## 2020-08-09 VITALS — BP 130/84 | HR 72 | Temp 97.6°F | Ht 66.5 in | Wt 180.0 lb

## 2020-08-09 DIAGNOSIS — M8949 Other hypertrophic osteoarthropathy, multiple sites: Secondary | ICD-10-CM | POA: Diagnosis not present

## 2020-08-09 DIAGNOSIS — Z0000 Encounter for general adult medical examination without abnormal findings: Secondary | ICD-10-CM | POA: Diagnosis not present

## 2020-08-09 DIAGNOSIS — Z23 Encounter for immunization: Secondary | ICD-10-CM

## 2020-08-09 DIAGNOSIS — Z7189 Other specified counseling: Secondary | ICD-10-CM

## 2020-08-09 DIAGNOSIS — N951 Menopausal and female climacteric states: Secondary | ICD-10-CM | POA: Diagnosis not present

## 2020-08-09 DIAGNOSIS — M159 Polyosteoarthritis, unspecified: Secondary | ICD-10-CM

## 2020-08-09 DIAGNOSIS — G479 Sleep disorder, unspecified: Secondary | ICD-10-CM | POA: Diagnosis not present

## 2020-08-09 DIAGNOSIS — I872 Venous insufficiency (chronic) (peripheral): Secondary | ICD-10-CM

## 2020-08-09 DIAGNOSIS — E785 Hyperlipidemia, unspecified: Secondary | ICD-10-CM

## 2020-08-09 LAB — COMPREHENSIVE METABOLIC PANEL
ALT: 14 U/L (ref 0–35)
AST: 18 U/L (ref 0–37)
Albumin: 4.2 g/dL (ref 3.5–5.2)
Alkaline Phosphatase: 64 U/L (ref 39–117)
BUN: 23 mg/dL (ref 6–23)
CO2: 30 mEq/L (ref 19–32)
Calcium: 9.6 mg/dL (ref 8.4–10.5)
Chloride: 100 mEq/L (ref 96–112)
Creatinine, Ser: 1.02 mg/dL (ref 0.40–1.20)
GFR: 52.91 mL/min — ABNORMAL LOW (ref >60.00)
Glucose, Bld: 97 mg/dL (ref 70–99)
Potassium: 3.9 mEq/L (ref 3.5–5.1)
Sodium: 136 mEq/L (ref 135–145)
Total Bilirubin: 0.9 mg/dL (ref 0.2–1.2)
Total Protein: 7.4 g/dL (ref 6.0–8.3)

## 2020-08-09 LAB — LDL CHOLESTEROL, DIRECT: Direct LDL: 140 mg/dL

## 2020-08-09 LAB — CBC
HCT: 46.4 % — ABNORMAL HIGH (ref 36.0–46.0)
Hemoglobin: 15.7 g/dL — ABNORMAL HIGH (ref 12.0–15.0)
MCHC: 33.8 g/dL (ref 30.0–36.0)
MCV: 93 fl (ref 78.0–100.0)
Platelets: 281 10*3/uL (ref 150.0–400.0)
RBC: 4.99 Mil/uL (ref 3.87–5.11)
RDW: 12.9 % (ref 11.5–15.5)
WBC: 5.8 10*3/uL (ref 4.0–10.5)

## 2020-08-09 LAB — LIPID PANEL
Cholesterol: 262 mg/dL — ABNORMAL HIGH (ref 0–200)
HDL: 44.6 mg/dL (ref >39.00)
NonHDL: 217.31
Total CHOL/HDL Ratio: 6
Triglycerides: 348 mg/dL — ABNORMAL HIGH (ref 0.0–149.0)
VLDL: 69.6 mg/dL — ABNORMAL HIGH (ref 0.0–40.0)

## 2020-08-09 NOTE — Assessment & Plan Note (Signed)
Doing fine on diuretic (HCTZ) and support hose Will check labs

## 2020-08-09 NOTE — Patient Instructions (Signed)
Please get the shingrix vaccine at your pharmacy.

## 2020-08-09 NOTE — Assessment & Plan Note (Signed)
Mild  Does okay with AM tylenol

## 2020-08-09 NOTE — Assessment & Plan Note (Signed)
See social history 

## 2020-08-09 NOTE — Assessment & Plan Note (Signed)
Variable Discussed limiting the benedryl

## 2020-08-09 NOTE — Assessment & Plan Note (Signed)
Moderate Discussed primary prevention ---will hold off If LDL over 190, will start weekly crestor 20

## 2020-08-09 NOTE — Progress Notes (Signed)
Subjective:    Patient ID: Sierra Salinas, female    DOB: 1946/09/12, 74 y.o.   MRN: [id number]  HPI Here for Medicare wellness visit and follow up of chronic health conditions This visit occurred during the SARS-CoV-2 public health emergency.  Safety protocols were in place, including screening questions prior to the visit, additional usage of staff PPE, and extensive cleaning of exam room while observing appropriate contact time as indicated for disinfecting solutions.   Reviewed form and advanced directives Reviewed other doctors Occasional glass of wine No tobacco Not really exercising--discussed Vision is okay Hearing is okay---right ear not as good No falls No depression or anhedonia Independent with instrumental ADLs No sig memory issues  No new concerns Continues on the diuretic for chronic edema Wears compression hose No ulcers or pain  Still taking estrogen twice a week No menopausal symptoms  Still having trouble sleeping Some nights are fine---other nights she has trouble initiating and maintaining Takes melatonin and benedryl nightly Does feel like things are on her mind No sig daytime somnolence  Now 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 acetaminophen (TYLENOL) 500 MG tablet Take 1,000 mg by mouth every 6 (six) hours as needed.    . diphenhydrAMINE (BENADRYL) 25 MG tablet Take 25 mg by mouth at bedtime as needed.    Marland Kitchen estradiol (ESTRACE) 0.5 MG tablet Take 1 tablet (0.5 mg total) by mouth 2 (two) times a week. 90 tablet 0  . Melatonin 5 MG CAPS Take 5 mg by mouth at bedtime.    . NON FORMULARY vitafusion    . triamterene-hydrochlorothiazide (DYAZIDE) 37.5-25 MG capsule TAKE  1 EACH (1 CAPSULE TOTAL) BY MOUTH DAILY. 90 capsule 3   No current facility-administered medications on file prior to visit.    Allergies  Allergen Reactions  . Nitrofurantoin     Pt denies    Past Medical History:  Diagnosis Date  . Allergy   . Cystitis    recurrent  . Hyperlipidemia   . Osteoarthritis of multiple joints   . Osteopenia   . 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 Unspecified venous (peripheral) insufficiency     Past Surgical History:  Procedure Laterality Date  . ABDOMINAL HYSTERECTOMY    . COLONOSCOPY    . NASAL SEPTUM SURGERY      Family History  Problem Relation Age of Onset  . Arthritis Mother   . Hypertension Mother   . Ovarian cancer Maternal Aunt 80  . Stroke Maternal Grandmother   . Colon cancer Maternal Aunt 67  . Breast cancer Neg Hx     Social History   Socioeconomic History  . Marital status: Divorced    Spouse name: Not on file  . Number of children: 2  . Years of education: Not on file  . Highest education level: Not on file  Occupational History  . Occupation: Psychologist, sport and exercise    Comment: Retired  Tobacco Use  . Smoking status: Former Research scientist (life sciences)  . Smokeless tobacco: Never Used  Substance and Sexual Activity  . Alcohol use: Yes    Alcohol/week: 0.0 standard drinks    Comment: occassional  . Drug use: No  . Sexual activity:  Not on file  Other Topics Concern  . Not on file  Social History Narrative   Has living will   Has designated daughter [name] to be health care POA   Would accept resuscitation attempts---no prolonged artificial ventilation   Would accept feeding tube for limited time depending on circumstances   Social Determinants of Health   Financial Resource Strain:   . Difficulty of Paying Living Expenses: Not on file  Food Insecurity:   . Worried About Charity fundraiser in the Last Year: Not on file  . Ran Out of Food in the Last Year: Not on file  Transportation Needs:   . Lack of Transportation  (Medical): Not on file  . Lack of Transportation (Non-Medical): Not on file  Physical Activity:   . Days of Exercise per Week: Not on file  . Minutes of Exercise per Session: Not on file  Stress:   . Feeling of Stress : Not on file  Social Connections:   . Frequency of Communication with Friends and Family: Not on file  . Frequency of Social Gatherings with Friends and Family: Not on file  . Attends Religious Services: Not on file  . Active Member of Clubs or Organizations: Not on file  . Attends Archivist Meetings: Not on file  . Marital Status: Not on file  Intimate Partner Violence:   . Fear of Current or Ex-Partner: Not on file  . Emotionally Abused: Not on file  . Physically Abused: Not on file  . Sexually Abused: Not on file   Review of Systems Appetite is fine Weight is stable Wears seat belt Teeth okay--keeps up with dentist Had bout of occipital neuralgia on left for about a week---ibuprofen helped No heartburn or sig dysphagia (rare choking if sitting wrong) Bowels fine--no blood No dysuria or hematuria. Remains continent No skin issues---sees derm No chest pain or palpitations No SOB No dizziness or syncope    Objective:   Physical Exam Constitutional:      Appearance: Normal appearance.  HENT:     Mouth/Throat:     Comments: No oral lesions Eyes:     Conjunctiva/sclera: Conjunctivae normal.     Pupils: Pupils are equal, round, and reactive to light.  Cardiovascular:     Rate and Rhythm: Normal rate and regular rhythm.     Pulses: Normal pulses.     Heart sounds: No murmur heard.  No gallop.   Pulmonary:     Effort: Pulmonary effort is normal.     Breath sounds: Normal breath sounds. No wheezing or rales.  Abdominal:     Palpations: Abdomen is soft.     Tenderness: There is no abdominal tenderness.  Musculoskeletal:     Cervical back: Neck supple.     Right lower leg: No edema.     Left lower leg: No edema.  Lymphadenopathy:      Cervical: No cervical adenopathy.  Skin:    General: Skin is warm.     Findings: No rash.  Neurological:     Mental Status: She is alert and oriented to person, place, and time.     Comments: President---"Biden, Trump, Bush----Obama" [phone] D-l-r-o-w Recall 3/3  Psychiatric:        Mood and Affect: Mood normal.        Behavior: Behavior normal.            Assessment & Plan:

## 2020-08-09 NOTE — Assessment & Plan Note (Signed)
Still on the twice a week estrogen Discussed trying to wean off

## 2020-08-09 NOTE — Addendum Note (Signed)
Addended by: Pilar Grammes on: 08/09/2020 12:02 PM   Modules accepted: Orders

## 2020-08-09 NOTE — Assessment & Plan Note (Signed)
I have personally reviewed the Medicare Annual Wellness questionnaire and have noted 1. The patient's medical and social history 2. Their use of alcohol, tobacco or illicit drugs 3. Their current medications and supplements 4. The patient's functional ability including ADL's, fall risks, home safety risks and hearing or visual             impairment. 5. Diet and physical activities 6. Evidence for depression or mood disorders  The patients weight, height, BMI and visual acuity have been recorded in the chart I have made referrals, counseling and provided education to the patient based review of the above and I have provided the pt with a written personalized care plan for preventive services.  I have provided you with a copy of your personalized plan for preventive services. Please take the time to review along with your updated medication list.  Normal colon at 70--no more screening Annual mammograms till 77 or done with estrogen Rx Flu vaccine today COVID booster Should get shingrix Discussed exercise

## 2020-08-22 ENCOUNTER — Other Ambulatory Visit: Payer: Self-pay | Admitting: Internal Medicine

## 2020-08-22 DIAGNOSIS — Z1231 Encounter for screening mammogram for malignant neoplasm of breast: Secondary | ICD-10-CM

## 2020-09-05 ENCOUNTER — Other Ambulatory Visit: Payer: Self-pay

## 2020-09-05 ENCOUNTER — Encounter: Payer: Self-pay | Admitting: Family Medicine

## 2020-09-05 ENCOUNTER — Ambulatory Visit (INDEPENDENT_AMBULATORY_CARE_PROVIDER_SITE_OTHER): Payer: Medicare HMO | Admitting: Family Medicine

## 2020-09-05 ENCOUNTER — Telehealth: Payer: Self-pay | Admitting: Internal Medicine

## 2020-09-05 VITALS — BP 130/70 | HR 85 | Temp 97.9°F | Ht 66.5 in | Wt 183.5 lb

## 2020-09-05 DIAGNOSIS — R310 Gross hematuria: Secondary | ICD-10-CM

## 2020-09-05 DIAGNOSIS — N3091 Cystitis, unspecified with hematuria: Secondary | ICD-10-CM | POA: Diagnosis not present

## 2020-09-05 LAB — POC URINALSYSI DIPSTICK (AUTOMATED)
Bilirubin, UA: NEGATIVE
Blood, UA: NEGATIVE
Glucose, UA: NEGATIVE
Ketones, UA: NEGATIVE
Nitrite, UA: NEGATIVE
Protein, UA: NEGATIVE
Spec Grav, UA: 1.015 (ref 1.010–1.025)
Urobilinogen, UA: 0.2 E.U./dL
pH, UA: 6.5 (ref 5.0–8.0)

## 2020-09-05 MED ORDER — SULFAMETHOXAZOLE-TRIMETHOPRIM 800-160 MG PO TABS: 1.0000 | ORAL_TABLET | Freq: Two times a day (BID) | ORAL | 0 refills | Status: AC

## 2020-09-05 NOTE — Progress Notes (Signed)
Sierra Groseclose T. Carlicia Leavens, MD, [place]  [facility] and [place] at [facility] [place], [address]  Phone: [phone]  FAX: [place] - 74 y.o. female  MRN [id number]  Date of Birth: 11-Aug-1946  Date: 09/05/2020  PCP: Venia Carbon, MD  Referral: Venia Carbon, MD  Chief Complaint  Patient presents with  . Hematuria    Has taken 2 doses on Bactrim she had on hand    This visit occurred during the SARS-CoV-2 public health emergency.  Safety protocols were in place, including screening questions prior to the visit, additional usage of staff PPE, and extensive cleaning of exam room while observing appropriate contact time as indicated for disinfecting solutions.   Subjective:   Sierra Salinas is a 74 y.o. very pleasant female patient with Body mass index is 29.17 kg/m. who presents with the following:  She reports that she started to get some pain while urinating and had some blood in her urine last evening.  At that point she did take some sulfa antibiotics and she also took another dose of that this morning.  She is starting to feel better.  She notes that in the past she was always has a little bit of blood when she has a UTI.  In the past she did have them frequently.  She denies any history of nephrolithiasis.  Review of Systems is noted in the HPI, as appropriate  Objective:   BP 130/70   Pulse 85   Temp 97.9 F (36.6 C) (Temporal)   Ht 5' 6.5" (1.689 m)   Wt 183 lb 8 oz (83.2 kg)   SpO2 98%   BMI 29.17 kg/m   GEN: No acute distress; alert,appropriate. PULM: Breathing comfortably in no respiratory distress PSYCH: Normally interactive.  No abdominal pain.  Laboratory and Imaging Data: Results for orders placed or performed in visit on 09/05/20  POCT Urinalysis Dipstick (Automated)  Result Value Ref Range   Color, UA Yellow    Clarity, UA Clear    Glucose, UA  Negative Negative   Bilirubin, UA Negative    Ketones, UA Negative    Spec Grav, UA 1.015 1.010 - 1.025   Blood, UA Negative    pH, UA 6.5 5.0 - 8.0   Protein, UA Negative Negative   Urobilinogen, UA 0.2 0.2 or 1.0 E.U./dL   Nitrite, UA Negative    Leukocytes, UA Small (1+) (A) Negative     Assessment and Plan:     ICD-10-CM   1. Cystitis with hematuria  N30.91   2. Gross hematuria  R31.0 Urine Culture    POCT Urinalysis Dipstick (Automated)   Clinically consistent with cystitis.  Testing will be confused secondary to ongoing antibiotic dosing.  Hopefully, the urine culture will grow out.  If it is negative, then will have to treat this clinically.  She has had some relief of symptoms with taking some Bactrim.  Meds ordered this encounter  Medications  . sulfamethoxazole-trimethoprim (BACTRIM DS) 800-160 MG tablet    Sig: Take 1 tablet by mouth 2 (two) times daily for 7 days.    Dispense:  14 tablet    Refill:  0   There are no discontinued medications. Orders Placed This Encounter  Procedures  . Urine Culture  . POCT Urinalysis Dipstick (Automated)    Follow-up: No follow-ups on file.  Signed,  Maud Deed. Mariavictoria Nottingham, MD   Outpatient Encounter  Medications as of 09/05/2020  Medication Sig  . acetaminophen (TYLENOL) 500 MG tablet Take 1,000 mg by mouth every 6 (six) hours as needed.  . diphenhydrAMINE (BENADRYL) 25 MG tablet Take 25 mg by mouth at bedtime as needed.  Marland Kitchen estradiol (ESTRACE) 0.5 MG tablet Take 1 tablet (0.5 mg total) by mouth 2 (two) times a week.  . Melatonin 5 MG CAPS Take 5 mg by mouth at bedtime.  . NON FORMULARY vitafusion  . triamterene-hydrochlorothiazide (DYAZIDE) 37.5-25 MG capsule TAKE 1 EACH (1 CAPSULE TOTAL) BY MOUTH DAILY.  Marland Kitchen sulfamethoxazole-trimethoprim (BACTRIM DS) 800-160 MG tablet Take 1 tablet by mouth 2 (two) times daily for 7 days.   No facility-administered encounter medications on file as of 09/05/2020.

## 2020-09-05 NOTE — Telephone Encounter (Signed)
PT called in due to she has a UTI and she had a prescription in her house from over a yr ago and it was only 3 and she was passing blood and staying in the bathroom. And wanted to know if she can get a prescription for it.

## 2020-09-06 LAB — URINE CULTURE
MICRO NUMBER:: 11113786
Result:: NO GROWTH
SPECIMEN QUALITY:: ADEQUATE

## 2020-09-30 ENCOUNTER — Ambulatory Visit
Admission: RE | Admit: 2020-09-30 | Discharge: 2020-09-30 | Disposition: A | Discharge status: Home / Self Care | Payer: Medicare HMO | Source: Ambulatory Visit | Attending: Internal Medicine | Admitting: Internal Medicine

## 2020-09-30 ENCOUNTER — Other Ambulatory Visit: Payer: Self-pay

## 2020-09-30 DIAGNOSIS — Z1231 Encounter for screening mammogram for malignant neoplasm of breast: Secondary | ICD-10-CM | POA: Insufficient documentation

## 2020-10-13 DIAGNOSIS — R69 Illness, unspecified: Secondary | ICD-10-CM | POA: Diagnosis not present

## 2020-11-21 DIAGNOSIS — H524 Presbyopia: Secondary | ICD-10-CM | POA: Diagnosis not present

## 2020-11-21 DIAGNOSIS — Z0100 Encounter for examination of eyes and vision without abnormal findings: Secondary | ICD-10-CM | POA: Diagnosis not present

## 2020-11-21 DIAGNOSIS — H5203 Hypermetropia, bilateral: Secondary | ICD-10-CM | POA: Diagnosis not present

## 2020-11-21 DIAGNOSIS — H2513 Age-related nuclear cataract, bilateral: Secondary | ICD-10-CM | POA: Diagnosis not present

## 2021-04-30 ENCOUNTER — Other Ambulatory Visit: Payer: Self-pay | Admitting: Internal Medicine

## 2021-05-16 ENCOUNTER — Encounter: Payer: Self-pay | Admitting: Internal Medicine

## 2021-05-16 ENCOUNTER — Ambulatory Visit (INDEPENDENT_AMBULATORY_CARE_PROVIDER_SITE_OTHER): Payer: Medicare HMO | Admitting: Internal Medicine

## 2021-05-16 ENCOUNTER — Other Ambulatory Visit: Payer: Self-pay

## 2021-05-16 VITALS — BP 116/78 | HR 90 | Temp 97.6°F | Ht 66.5 in | Wt 180.0 lb

## 2021-05-16 DIAGNOSIS — N3000 Acute cystitis without hematuria: Secondary | ICD-10-CM | POA: Insufficient documentation

## 2021-05-16 DIAGNOSIS — R300 Dysuria: Secondary | ICD-10-CM

## 2021-05-16 LAB — POC URINALSYSI DIPSTICK (AUTOMATED)
Bilirubin, UA: NEGATIVE
Blood, UA: NEGATIVE
Glucose, UA: NEGATIVE
Ketones, UA: NEGATIVE
Nitrite, UA: NEGATIVE
Protein, UA: NEGATIVE
Spec Grav, UA: 1.02 (ref 1.010–1.025)
Urobilinogen, UA: 0.2 E.U./dL
pH, UA: 6 (ref 5.0–8.0)

## 2021-05-16 MED ORDER — SULFAMETHOXAZOLE-TRIMETHOPRIM 800-160 MG PO TABS: 1.0000 | ORAL_TABLET | Freq: Two times a day (BID) | ORAL | 1 refills | Status: DC

## 2021-05-16 NOTE — Progress Notes (Signed)
Subjective:    Patient ID: Sierra Salinas, female    DOB: October 06, 1946, 75 y.o.   MRN: [id number]  HPI Here due to urinary symptoms This visit occurred during the SARS-CoV-2 public health emergency.  Safety protocols were in place, including screening questions prior to the visit, additional usage of staff PPE, and extensive cleaning of exam room while observing appropriate contact time as indicated for disinfecting solutions.   Started 3 days ago Took left over bactrim starting 2 days ago (has taken 3) Dysuria and urgency/frequency No blood No fever  Current Outpatient Medications on File Prior to Visit  Medication Sig Dispense Refill   acetaminophen (TYLENOL) 500 MG tablet Take 1,000 mg by mouth every 6 (six) hours as needed.     diphenhydrAMINE (BENADRYL) 25 MG tablet Take 25 mg by mouth at bedtime as needed.     estradiol (ESTRACE) 0.5 MG tablet Take 1 tablet (0.5 mg total) by mouth 2 (two) times a week. 90 tablet 0   Melatonin 5 MG CAPS Take 5 mg by mouth at bedtime.     NON FORMULARY vitafusion     triamterene-hydrochlorothiazide (DYAZIDE) 37.5-25 MG capsule TAKE 1 EACH (1 CAPSULE TOTAL) BY MOUTH DAILY. 90 capsule 3   No current facility-administered medications on file prior to visit.    Allergies  Allergen Reactions   Nitrofurantoin     Pt denies    Past Medical History:  Diagnosis Date   Allergy    Cystitis    recurrent   Hyperlipidemia    Osteoarthritis of multiple joints    Osteopenia    Squamous cell carcinoma 2000   right leg   Unspecified venous (peripheral) insufficiency     Past Surgical History:  Procedure Laterality Date   ABDOMINAL HYSTERECTOMY     COLONOSCOPY     NASAL SEPTUM SURGERY      Family History  Problem Relation Age of Onset   Arthritis Mother    Hypertension Mother    Ovarian cancer Maternal Aunt 16   Stroke Maternal Grandmother    Colon cancer Maternal Aunt 67   Breast cancer Neg Hx     Social History   Socioeconomic  History   Marital status: Divorced    Spouse name: Not on file   Number of children: 2   Years of education: Not on file   Highest education level: Not on file  Occupational History   Occupation: Psychologist, sport and exercise    Comment: Retired  Tobacco Use   Smoking status: Former    Pack years: 0.00   Smokeless tobacco: Never  Substance and Sexual Activity   Alcohol use: Yes    Alcohol/week: 0.0 standard drinks    Comment: occassional   Drug use: No   Sexual activity: Not on file  Other Topics Concern   Not on file  Social History Narrative   Has living will   Has designated daughter [name] to be health care POA   Would accept resuscitation attempts---no prolonged artificial ventilation   Would accept feeding tube for limited time depending on circumstances   Social Determinants of Health   Financial Resource Strain: Not on file  Food Insecurity: Not on file  Transportation Needs: Not on file  Physical Activity: Not on file  Stress: Not on file  Social Connections: Not on file  Intimate Partner Violence: Not on file   Review of Systems Used to have more common infections--but rarer now Generally infections become hemorrhagic quickly Did have cystoscopy in  the past No sexual relationship No vaginal products     Objective:   Physical Exam Abdominal:     Palpations: Abdomen is soft.     Tenderness: There is no abdominal tenderness. There is no right CVA tenderness or left CVA tenderness.           Assessment & Plan:

## 2021-05-16 NOTE — Assessment & Plan Note (Addendum)
Past frequent issues then quiet for years No clear inciting factors Did have negative cystoscopy long ago  Will plan 3 day Rx with septra for recurrent symptoms (this is second in past year). Consider cranberry, probiotic, etc for prevention If hematuria recurs, may need urology evaluation for another cystoscopy

## 2021-07-04 DIAGNOSIS — Q828 Other specified congenital malformations of skin: Secondary | ICD-10-CM | POA: Diagnosis not present

## 2021-07-04 DIAGNOSIS — L578 Other skin changes due to chronic exposure to nonionizing radiation: Secondary | ICD-10-CM | POA: Diagnosis not present

## 2021-07-04 DIAGNOSIS — Z86018 Personal history of other benign neoplasm: Secondary | ICD-10-CM | POA: Diagnosis not present

## 2021-07-04 DIAGNOSIS — D225 Melanocytic nevi of trunk: Secondary | ICD-10-CM | POA: Diagnosis not present

## 2021-07-04 DIAGNOSIS — Z85828 Personal history of other malignant neoplasm of skin: Secondary | ICD-10-CM | POA: Diagnosis not present

## 2021-07-04 DIAGNOSIS — D485 Neoplasm of uncertain behavior of skin: Secondary | ICD-10-CM | POA: Diagnosis not present

## 2021-07-04 DIAGNOSIS — L814 Other melanin hyperpigmentation: Secondary | ICD-10-CM | POA: Diagnosis not present

## 2021-07-04 DIAGNOSIS — L821 Other seborrheic keratosis: Secondary | ICD-10-CM | POA: Diagnosis not present

## 2021-08-15 ENCOUNTER — Encounter: Payer: Self-pay | Admitting: Internal Medicine

## 2021-08-15 ENCOUNTER — Ambulatory Visit (INDEPENDENT_AMBULATORY_CARE_PROVIDER_SITE_OTHER): Payer: Medicare HMO | Admitting: Internal Medicine

## 2021-08-15 ENCOUNTER — Other Ambulatory Visit: Payer: Self-pay

## 2021-08-15 VITALS — BP 108/66 | HR 81 | Temp 97.5°F | Ht 66.5 in | Wt 178.0 lb

## 2021-08-15 DIAGNOSIS — M159 Polyosteoarthritis, unspecified: Secondary | ICD-10-CM | POA: Diagnosis not present

## 2021-08-15 DIAGNOSIS — E785 Hyperlipidemia, unspecified: Secondary | ICD-10-CM | POA: Diagnosis not present

## 2021-08-15 DIAGNOSIS — G479 Sleep disorder, unspecified: Secondary | ICD-10-CM | POA: Diagnosis not present

## 2021-08-15 DIAGNOSIS — I872 Venous insufficiency (chronic) (peripheral): Secondary | ICD-10-CM | POA: Diagnosis not present

## 2021-08-15 DIAGNOSIS — N1831 Chronic kidney disease, stage 3a: Secondary | ICD-10-CM

## 2021-08-15 DIAGNOSIS — Z0000 Encounter for general adult medical examination without abnormal findings: Secondary | ICD-10-CM | POA: Diagnosis not present

## 2021-08-15 DIAGNOSIS — N951 Menopausal and female climacteric states: Secondary | ICD-10-CM | POA: Diagnosis not present

## 2021-08-15 DIAGNOSIS — Z23 Encounter for immunization: Secondary | ICD-10-CM

## 2021-08-15 DIAGNOSIS — N6452 Nipple discharge: Secondary | ICD-10-CM

## 2021-08-15 LAB — HEPATIC FUNCTION PANEL
ALT: 11 U/L (ref 0–35)
AST: 17 U/L (ref 0–37)
Albumin: 4.2 g/dL (ref 3.5–5.2)
Alkaline Phosphatase: 72 U/L (ref 39–117)
Bilirubin, Direct: 0.1 mg/dL (ref 0.0–0.3)
Total Bilirubin: 0.9 mg/dL (ref 0.2–1.2)
Total Protein: 6.9 g/dL (ref 6.0–8.3)

## 2021-08-15 LAB — RENAL FUNCTION PANEL
Albumin: 4.2 g/dL (ref 3.5–5.2)
BUN: 25 mg/dL — ABNORMAL HIGH (ref 6–23)
CO2: 28 mEq/L (ref 19–32)
Calcium: 9.9 mg/dL (ref 8.4–10.5)
Chloride: 101 mEq/L (ref 96–112)
Creatinine, Ser: 1.2 mg/dL (ref 0.40–1.20)
GFR: 44.29 mL/min — ABNORMAL LOW (ref >60.00)
Glucose, Bld: 96 mg/dL (ref 70–99)
Phosphorus: 3.3 mg/dL (ref 2.3–4.6)
Potassium: 4.1 mEq/L (ref 3.5–5.1)
Sodium: 138 mEq/L (ref 135–145)

## 2021-08-15 LAB — CBC
HCT: 43.8 % (ref 36.0–46.0)
Hemoglobin: 14.7 g/dL (ref 12.0–15.0)
MCHC: 33.5 g/dL (ref 30.0–36.0)
MCV: 92 fl (ref 78.0–100.0)
Platelets: 269 10*3/uL (ref 150.0–400.0)
RBC: 4.76 Mil/uL (ref 3.87–5.11)
RDW: 13.1 % (ref 11.5–15.5)
WBC: 5.3 10*3/uL (ref 4.0–10.5)

## 2021-08-15 NOTE — Patient Instructions (Addendum)
You can try over the counter diclofenac gel on your painful feet---to see if you can hold off on taking the ibuprofen every day.  Please set up your diagnostic mammogram at [facility]. (You should call)

## 2021-08-15 NOTE — Assessment & Plan Note (Signed)
Serous only and small quantity Only bilateral cystic changes on exam--but will set up diagnostic mammogram

## 2021-08-15 NOTE — Assessment & Plan Note (Signed)
Mild If worse, will stop all ibuprofen

## 2021-08-15 NOTE — Progress Notes (Signed)
Subjective:    Patient ID: Sierra Salinas, female    DOB: [birth year], 75 y.o.   MRN: [id number]  HPI Here for Medicare wellness visit and follow up of chronic health conditions This visit occurred during the SARS-CoV-2 public health emergency.  Safety protocols were in place, including screening questions prior to the visit, additional usage of staff PPE, and extensive cleaning of exam room while observing appropriate contact time as indicated for disinfecting solutions.   Reviewed advanced directives Reviewed other doctors---Dr Gould--ophthal, Dr Lenox Ponds, Dr Sharyn Blitz No hospitalizations or surgery in past year Vision is fine Hearing in right is not great---does okay though (unless competing sounds) Rare glass of wine No tobacco Still not exercising No falls No depression or anhedonia Independent with instrumental ADLs No sig memory issues  Has noticed a drop of clear yellow discharge from left breast--if pressed Started ?6 months ago No pain or blood No mass apparent  Continues on estrogen--twice a week Didn't tolerate wean recently  Continues with support hose On the diuretic still  Known high cholesterol Still not excited about medication  Last GFR 52  Current Outpatient Medications on File Prior to Visit  Medication Sig Dispense Refill   diphenhydrAMINE (BENADRYL) 25 MG tablet Take 25 mg by mouth at bedtime as needed.     estradiol (ESTRACE) 0.5 MG tablet Take 1 tablet (0.5 mg total) by mouth 2 (two) times a week. 90 tablet 0   ibuprofen (ADVIL) 200 MG tablet Take 200 mg by mouth every 6 (six) hours as needed.     NON FORMULARY vitafusion     triamterene-hydrochlorothiazide (DYAZIDE) 37.5-25 MG capsule TAKE 1 EACH (1 CAPSULE TOTAL) BY MOUTH DAILY. 90 capsule 3   No current facility-administered medications on file prior to visit.    Allergies  Allergen Reactions   Nitrofurantoin     Pt denies    Past Medical History:  Diagnosis Date    Allergy    Cystitis    recurrent   Hyperlipidemia    Osteoarthritis of multiple joints    Osteopenia    Squamous cell carcinoma 2000   right leg   Unspecified venous (peripheral) insufficiency     Past Surgical History:  Procedure Laterality Date   ABDOMINAL HYSTERECTOMY     COLONOSCOPY     NASAL SEPTUM SURGERY      Family History  Problem Relation Age of Onset   Arthritis Mother    Hypertension Mother    Ovarian cancer Maternal Aunt 65   Stroke Maternal Grandmother    Colon cancer Maternal Aunt 67   Breast cancer Neg Hx     Social History   Socioeconomic History   Marital status: Divorced    Spouse name: Not on file   Number of children: 2   Years of education: Not on file   Highest education level: Not on file  Occupational History   Occupation: Psychologist, sport and exercise    Comment: Retired  Tobacco Use   Smoking status: Former   Smokeless tobacco: Never  Substance and Sexual Activity   Alcohol use: Yes    Alcohol/week: 0.0 standard drinks    Comment: occassional   Drug use: No   Sexual activity: Not on file  Other Topics Concern   Not on file  Social History Narrative   Has living will   Has designated daughter Sierra Salinas to be health care POA   Would accept resuscitation attempts---no prolonged artificial ventilation   Would accept feeding tube for limited time  depending on circumstances   Social Determinants of Health   Financial Resource Strain: Not on file  Food Insecurity: Not on file  Transportation Needs: Not on file  Physical Activity: Not on file  Stress: Not on file  Social Connections: Not on file  Intimate Partner Violence: Not on file   Review of Systems Appetite is good Weight is stable Sleeps okay with the benedryl---discussed (takes it every night). Melatonin no help Wears seat belt Teeth are fine--keeps up with dentist No suspicious skin lesions No heartburn or dysphagia Bowels move fine--now taking probiotic. No blood No dysuria,  hematuria or incontinence Lots of pain in her feet---taking ibuprofen 200mg  daily (works better than tylenol) No chest pain or SOB No dizziness or syncope    Objective:   Physical Exam Constitutional:      Appearance: Normal appearance.  HENT:     Mouth/Throat:     Comments: No lesions Eyes:     Conjunctiva/sclera: Conjunctivae normal.     Pupils: Pupils are equal, round, and reactive to light.  Cardiovascular:     Rate and Rhythm: Normal rate and regular rhythm.     Pulses: Normal pulses.     Heart sounds: No murmur heard.   No gallop.  Pulmonary:     Effort: Pulmonary effort is normal.     Breath sounds: Normal breath sounds. No wheezing or rales.  Abdominal:     Palpations: Abdomen is soft.     Tenderness: There is no abdominal tenderness.  Genitourinary:    Comments: Prominent cystic changes bilaterally Small amount cystic drainage with direct nipple pressure Musculoskeletal:     Cervical back: Neck supple.     Right lower leg: No edema.     Left lower leg: No edema.  Lymphadenopathy:     Cervical: No cervical adenopathy.  Skin:    Findings: No lesion or rash.  Neurological:     Mental Status: She is alert and oriented to person, place, and time.     Comments: President--- "Sierra Salinas, Sierra Salinas" [id number] Recall 3/3  Psychiatric:        Mood and Affect: Mood normal.        Behavior: Behavior normal.           Assessment & Plan:

## 2021-08-15 NOTE — Assessment & Plan Note (Signed)
Using daily ibuprofen Will check GFR again---try topical instead (diclofenac)

## 2021-08-15 NOTE — Assessment & Plan Note (Signed)
Only sleeps with the daily diphenhydramine Cautioned her to watch for side effects

## 2021-08-15 NOTE — Addendum Note (Signed)
Addended by: Pilar Grammes on: 08/15/2021 11:07 AM   Modules accepted: Orders

## 2021-08-15 NOTE — Assessment & Plan Note (Addendum)
I have personally reviewed the Medicare Annual Wellness questionnaire and have noted 1. The patient's medical and social history 2. Their use of alcohol, tobacco or illicit drugs 3. Their current medications and supplements 4. The patient's functional ability including ADL's, fall risks, home safety risks and hearing or visual             impairment. 5. Diet and physical activities 6. Evidence for depression or mood disorders  The patients weight, height, BMI and visual acuity have been recorded in the chart I have made referrals, counseling and provided education to the patient based review of the above and I have provided the pt with a written personalized care plan for preventive services.  I have provided you with a copy of your personalized plan for preventive services. Please take the time to review along with your updated medication list.  Discussed exercise Flu vaccine today Bivalent COVID soon--at pharmacy Consider shingrix when covered Done with screening colonoscopies

## 2021-08-15 NOTE — Assessment & Plan Note (Signed)
LDL ~140 so no Rx Will hold off on recheck

## 2021-08-15 NOTE — Assessment & Plan Note (Signed)
Does well with the HCTZ and support hose

## 2021-08-15 NOTE — Assessment & Plan Note (Signed)
Hasn't been able to stop the twice a week estrogen

## 2021-08-16 ENCOUNTER — Telehealth: Payer: Self-pay | Admitting: Internal Medicine

## 2021-08-16 ENCOUNTER — Other Ambulatory Visit: Payer: Self-pay | Admitting: Internal Medicine

## 2021-08-16 DIAGNOSIS — N6452 Nipple discharge: Secondary | ICD-10-CM

## 2021-08-16 NOTE — Telephone Encounter (Signed)
Pt called stating that she was told that the order placed for the Diag Breast was incorrect. Please advise.

## 2021-08-18 NOTE — Telephone Encounter (Signed)
Spoke to pt. She said they needed an order for a Unilateral Left Diagnostic mammogram. So I have sent that order in.

## 2021-08-18 NOTE — Addendum Note (Signed)
Addended by: Pilar Grammes on: 08/18/2021 03:50 PM   Modules accepted: Orders

## 2021-08-29 ENCOUNTER — Other Ambulatory Visit: Payer: Self-pay

## 2021-08-29 ENCOUNTER — Ambulatory Visit
Admission: RE | Admit: 2021-08-29 | Discharge: 2021-08-29 | Disposition: A | Discharge status: Home / Self Care | Payer: Medicare HMO | Source: Ambulatory Visit | Attending: Internal Medicine | Admitting: Internal Medicine

## 2021-08-29 DIAGNOSIS — N6452 Nipple discharge: Secondary | ICD-10-CM | POA: Diagnosis not present

## 2021-08-29 DIAGNOSIS — R922 Inconclusive mammogram: Secondary | ICD-10-CM | POA: Diagnosis not present

## 2021-11-10 ENCOUNTER — Telehealth: Payer: Medicare HMO | Admitting: Physician Assistant

## 2021-11-10 ENCOUNTER — Telehealth: Payer: Self-pay | Admitting: Internal Medicine

## 2021-11-10 DIAGNOSIS — B9689 Other specified bacterial agents as the cause of diseases classified elsewhere: Secondary | ICD-10-CM | POA: Diagnosis not present

## 2021-11-10 DIAGNOSIS — J019 Acute sinusitis, unspecified: Secondary | ICD-10-CM

## 2021-11-10 MED ORDER — AMOXICILLIN-POT CLAVULANATE 875-125 MG PO TABS: 1.0000 | ORAL_TABLET | Freq: Two times a day (BID) | ORAL | 0 refills | Status: DC

## 2021-11-10 NOTE — Telephone Encounter (Signed)
Spoke to pt. Advised her that we did not have any availability today for her to be seen. She will need to be seen to get medication. I did give her information on [email or web address]

## 2021-11-10 NOTE — Telephone Encounter (Signed)
Pt called stating that she is coughing up dark mucus and its been going on every since Monday. Pt is asking if she can get an antibiotic called in. There are no available appts. Please advise.

## 2021-11-10 NOTE — Progress Notes (Signed)
Virtual Visit Consent   Sierra Salinas, you are scheduled for a virtual visit with a [facility] provider today.     Just as with appointments in the office, your consent must be obtained to participate.  Your consent will be active for this visit and any virtual visit you may have with one of our providers in the next 365 days.     If you have a MyChart account, a copy of this consent can be sent to you electronically.  All virtual visits are billed to your insurance company just like a traditional visit in the office.    As this is a virtual visit, video technology does not allow for your provider to perform a traditional examination.  This may limit your provider's ability to fully assess your condition.  If your provider identifies any concerns that need to be evaluated in person or the need to arrange testing (such as labs, EKG, etc.), we will make arrangements to do so.     Although advances in technology are sophisticated, we cannot ensure that it will always work on either your end or our end.  If the connection with a video visit is poor, the visit may have to be switched to a telephone visit.  With either a video or telephone visit, we are not always able to ensure that we have a secure connection.     I need to obtain your verbal consent now.   Are you willing to proceed with your visit today?    Sierra Salinas has provided verbal consent on 11/10/2021 for a virtual visit (video or telephone).   Leeanne Rio, [place]   Date: 11/10/2021 12:00 PM   Virtual Visit via Video Note   I, Leeanne Rio, connected with  Sierra Salinas  ([id number], August 19, 1946) on 11/10/21 at 11:45 AM EST by a video-enabled telemedicine application and verified that I am speaking with the correct person using two identifiers.  Location: Patient: Virtual Visit Location Patient: Home Provider: Virtual Visit Location Provider: Home Office   I discussed the limitations of evaluation and  management by telemedicine and the availability of in person appointments. The patient expressed understanding and agreed to proceed.    History of Present Illness: Sierra Salinas is a 75 y.o. who identifies as a female who was assigned female at birth, and is being seen today for possible sinusitis. Notes head congestion with sinus pressure, now with change in mucous coloration and sinus pain. Denies fever, chills, chest congestion. Occasional mild cough. Denies any chest pain or SOB. Denies GI symptoms. Denies recent travel or sick contact. Took a home COVID test which was negative.   HPI: HPI  Problems:  Patient Active Problem List   Diagnosis Date Noted   Stage 3a chronic kidney disease ([place]) 08/15/2021   Discharge from left nipple 08/15/2021   Sleep disturbance 08/07/2019   Menopausal syndrome 02/18/2015   Advance directive discussed with patient 02/18/2015   Routine general medical examination at a health care facility 10/15/2011   Osteoarthritis of multiple joints    Hyperlipemia 08/17/2009   Chronic venous insufficiency 07/23/2007   OSTEOPENIA 07/23/2007    Allergies:  Allergies  Allergen Reactions   Nitrofurantoin     Pt denies   Medications:  Current Outpatient Medications:    amoxicillin-clavulanate (AUGMENTIN) 875-125 MG tablet, Take 1 tablet by mouth 2 (two) times daily., Disp: 14 tablet, Rfl: 0   diphenhydrAMINE (BENADRYL) 25 MG tablet, Take 25 mg by  mouth at bedtime as needed., Disp: , Rfl:    estradiol (ESTRACE) 0.5 MG tablet, Take 1 tablet (0.5 mg total) by mouth 2 (two) times a week., Disp: 90 tablet, Rfl: 0   ibuprofen (ADVIL) 200 MG tablet, Take 200 mg by mouth every 6 (six) hours as needed., Disp: , Rfl:    NON FORMULARY, vitafusion, Disp: , Rfl:    triamterene-hydrochlorothiazide (DYAZIDE) 37.5-25 MG capsule, TAKE 1 EACH (1 CAPSULE TOTAL) BY MOUTH DAILY., Disp: 90 capsule, Rfl: 3  Observations/Objective: Patient is well-developed, well-nourished in no acute  distress.  Resting comfortably at home.  Head is normocephalic, atraumatic.  No labored breathing. Speech is clear and coherent with logical content.  Patient is alert and oriented at baseline.   Assessment and Plan: 1. Acute bacterial sinusitis - amoxicillin-clavulanate (AUGMENTIN) 875-125 MG tablet; Take 1 tablet by mouth 2 (two) times daily.  Dispense: 14 tablet; Refill: 0  Rx Augmentin.  Increase fluids.  Rest.  Saline nasal spray.  Probiotic.  Mucinex as directed.  Humidifier in bedroom.  Call or return to clinic if symptoms are not improving.   Follow Up Instructions: I discussed the assessment and treatment plan with the patient. The patient was provided an opportunity to ask questions and all were answered. The patient agreed with the plan and demonstrated an understanding of the instructions.  A copy of instructions were sent to the patient via MyChart unless otherwise noted below.   The patient was advised to call back or seek an in-person evaluation if the symptoms worsen or if the condition fails to improve as anticipated.  Time:  I spent 12 minutes with the patient via telehealth technology discussing the above problems/concerns.    Leeanne Rio, PA-C

## 2021-11-10 NOTE — Patient Instructions (Signed)
Sierra Salinas, thank you for joining Leeanne Rio, PA-C for today's virtual visit.  While this provider is not your primary care provider (PCP), if your PCP is located in our provider database this encounter information will be shared with them immediately following your visit.  Consent: (Patient) Sierra Salinas provided verbal consent for this virtual visit at the beginning of the encounter.  Current Medications:  Current Outpatient Medications:    diphenhydrAMINE (BENADRYL) 25 MG tablet, Take 25 mg by mouth at bedtime as needed., Disp: , Rfl:    estradiol (ESTRACE) 0.5 MG tablet, Take 1 tablet (0.5 mg total) by mouth 2 (two) times a week., Disp: 90 tablet, Rfl: 0   ibuprofen (ADVIL) 200 MG tablet, Take 200 mg by mouth every 6 (six) hours as needed., Disp: , Rfl:    NON FORMULARY, vitafusion, Disp: , Rfl:    triamterene-hydrochlorothiazide (DYAZIDE) 37.5-25 MG capsule, TAKE 1 EACH (1 CAPSULE TOTAL) BY MOUTH DAILY., Disp: 90 capsule, Rfl: 3   Medications ordered in this encounter:  No orders of the defined types were placed in this encounter.    *If you need refills on other medications prior to your next appointment, please contact your pharmacy*  Follow-Up: Call back or seek an in-person evaluation if the symptoms worsen or if the condition fails to improve as anticipated.  Other Instructions Please take antibiotic as directed.  I switched the antibiotic to Augmentin for cost reasons as you insurance was going to charge 47.00 for the Doxycycline. Increase fluid intake.  Use Saline nasal spray.  Take a daily multivitamin.  Place a humidifier in the bedroom.  Please call or return clinic if symptoms are not improving.  Sinusitis Sinusitis is redness, soreness, and swelling (inflammation) of the paranasal sinuses. Paranasal sinuses are air pockets within the bones of your face (beneath the eyes, the middle of the forehead, or above the eyes). In healthy paranasal sinuses,  mucus is able to drain out, and air is able to circulate through them by way of your nose. However, when your paranasal sinuses are inflamed, mucus and air can become trapped. This can allow bacteria and other germs to grow and cause infection. Sinusitis can develop quickly and last only a short time (acute) or continue over a long period (chronic). Sinusitis that lasts for more than 12 weeks is considered chronic.  CAUSES  Causes of sinusitis include: Allergies. Structural abnormalities, such as displacement of the cartilage that separates your nostrils (deviated septum), which can decrease the air flow through your nose and sinuses and affect sinus drainage. Functional abnormalities, such as when the small hairs (cilia) that line your sinuses and help remove mucus do not work properly or are not present. SYMPTOMS  Symptoms of acute and chronic sinusitis are the same. The primary symptoms are pain and pressure around the affected sinuses. Other symptoms include: Upper toothache. Earache. Headache. Bad breath. Decreased sense of smell and taste. A cough, which worsens when you are lying flat. Fatigue. Fever. Thick drainage from your nose, which often is green and may contain pus (purulent). Swelling and warmth over the affected sinuses. DIAGNOSIS  Your caregiver will perform a physical exam. During the exam, your caregiver may: Look in your nose for signs of abnormal growths in your nostrils (nasal polyps). Tap over the affected sinus to check for signs of infection. View the inside of your sinuses (endoscopy) with a special imaging device with a light attached (endoscope), which is inserted into your sinuses. If  your caregiver suspects that you have chronic sinusitis, one or more of the following tests may be recommended: Allergy tests. Nasal culture A sample of mucus is taken from your nose and sent to a lab and screened for bacteria. Nasal cytology A sample of mucus is taken from your  nose and examined by your caregiver to determine if your sinusitis is related to an allergy. TREATMENT  Most cases of acute sinusitis are related to a viral infection and will resolve on their own within 10 days. Sometimes medicines are prescribed to help relieve symptoms (pain medicine, decongestants, nasal steroid sprays, or saline sprays).  However, for sinusitis related to a bacterial infection, your caregiver will prescribe antibiotic medicines. These are medicines that will help kill the bacteria causing the infection.  Rarely, sinusitis is caused by a fungal infection. In theses cases, your caregiver will prescribe antifungal medicine. For some cases of chronic sinusitis, surgery is needed. Generally, these are cases in which sinusitis recurs more than 3 times per year, despite other treatments. HOME CARE INSTRUCTIONS  Drink plenty of water. Water helps thin the mucus so your sinuses can drain more easily. Use a humidifier. Inhale steam 3 to 4 times a day (for example, sit in the bathroom with the shower running). Apply a warm, moist washcloth to your face 3 to 4 times a day, or as directed by your caregiver. Use saline nasal sprays to help moisten and clean your sinuses. Take over-the-counter or prescription medicines for pain, discomfort, or fever only as directed by your caregiver. SEEK IMMEDIATE MEDICAL CARE IF: You have increasing pain or severe headaches. You have nausea, vomiting, or drowsiness. You have swelling around your face. You have vision problems. You have a stiff neck. You have difficulty breathing. MAKE SURE YOU:  Understand these instructions. Will watch your condition. Will get help right away if you are not doing well or get worse. Document Released: 10/29/2005 Document Revised: 01/21/2012 Document Reviewed: 11/13/2011 [facility] Patient Information 2014 [place], [place].    If you have been instructed to have an in-person evaluation today at a local Urgent Care  facility, please use the link below. It will take you to a list of all of our available [facility], including address, phone number and hours of operation. Please do not delay care.  [facility]  If you or a family member do not have a primary care provider, use the link below to schedule a visit and establish care. When you choose a  primary care physician or advanced practice provider, you gain a long-term partner in health. Find a Primary Care Provider  Learn more about 's in-office and virtual care options: [facility]

## 2021-11-27 DIAGNOSIS — H25813 Combined forms of age-related cataract, bilateral: Secondary | ICD-10-CM | POA: Diagnosis not present

## 2021-11-27 DIAGNOSIS — H52203 Unspecified astigmatism, bilateral: Secondary | ICD-10-CM | POA: Diagnosis not present

## 2021-11-27 DIAGNOSIS — H5203 Hypermetropia, bilateral: Secondary | ICD-10-CM | POA: Diagnosis not present

## 2021-12-29 DIAGNOSIS — Z0100 Encounter for examination of eyes and vision without abnormal findings: Secondary | ICD-10-CM | POA: Diagnosis not present

## 2022-01-16 ENCOUNTER — Telehealth: Payer: Self-pay | Admitting: Internal Medicine

## 2022-01-16 NOTE — Telephone Encounter (Signed)
Pt called wanting to get a refill on estradiol (ESTRACE) 0.5 MG tablet. She stated that she starts and stops medication  ?

## 2022-01-17 ENCOUNTER — Other Ambulatory Visit: Payer: Self-pay | Admitting: Family

## 2022-01-17 MED ORDER — ESTRADIOL 0.5 MG PO TABS: 0.5000 mg | ORAL_TABLET | ORAL | 0 refills | Status: DC

## 2022-01-17 NOTE — Addendum Note (Signed)
Addended by: Emelia Salisbury C on: 01/17/2022 10:44 AM ? ? Modules accepted: Orders ? ?

## 2022-01-17 NOTE — Telephone Encounter (Signed)
Patient wanted to make sure that script has been sent to [facility] for Estrace. ?Script shows that it is to be filled later? ?

## 2022-01-18 MED ORDER — ESTRADIOL 0.5 MG PO TABS: 0.5000 mg | ORAL_TABLET | ORAL | 0 refills | Status: DC

## 2022-01-18 NOTE — Addendum Note (Signed)
Addended by: Francella Solian on: 01/18/2022 03:52 PM ? ? Modules accepted: Orders ? ?

## 2022-04-06 DIAGNOSIS — D485 Neoplasm of uncertain behavior of skin: Secondary | ICD-10-CM | POA: Diagnosis not present

## 2022-04-06 DIAGNOSIS — L820 Inflamed seborrheic keratosis: Secondary | ICD-10-CM | POA: Diagnosis not present

## 2022-04-29 ENCOUNTER — Other Ambulatory Visit: Payer: Self-pay | Admitting: Internal Medicine

## 2022-07-02 DIAGNOSIS — L578 Other skin changes due to chronic exposure to nonionizing radiation: Secondary | ICD-10-CM | POA: Diagnosis not present

## 2022-07-02 DIAGNOSIS — D225 Melanocytic nevi of trunk: Secondary | ICD-10-CM | POA: Diagnosis not present

## 2022-07-02 DIAGNOSIS — L821 Other seborrheic keratosis: Secondary | ICD-10-CM | POA: Diagnosis not present

## 2022-07-02 DIAGNOSIS — L814 Other melanin hyperpigmentation: Secondary | ICD-10-CM | POA: Diagnosis not present

## 2022-07-02 DIAGNOSIS — Z86018 Personal history of other benign neoplasm: Secondary | ICD-10-CM | POA: Diagnosis not present

## 2022-08-20 ENCOUNTER — Other Ambulatory Visit: Payer: Self-pay | Admitting: Internal Medicine

## 2022-08-20 DIAGNOSIS — Z1231 Encounter for screening mammogram for malignant neoplasm of breast: Secondary | ICD-10-CM

## 2022-08-21 ENCOUNTER — Encounter: Payer: Medicare HMO | Admitting: Internal Medicine

## 2022-09-11 ENCOUNTER — Ambulatory Visit (INDEPENDENT_AMBULATORY_CARE_PROVIDER_SITE_OTHER): Payer: Medicare HMO | Admitting: Internal Medicine

## 2022-09-11 ENCOUNTER — Encounter: Payer: Self-pay | Admitting: Internal Medicine

## 2022-09-11 VITALS — BP 110/70 | HR 77 | Temp 97.4°F | Ht 66.75 in | Wt 183.0 lb

## 2022-09-11 DIAGNOSIS — N1831 Chronic kidney disease, stage 3a: Secondary | ICD-10-CM

## 2022-09-11 DIAGNOSIS — I872 Venous insufficiency (chronic) (peripheral): Secondary | ICD-10-CM

## 2022-09-11 DIAGNOSIS — S46811A Strain of other muscles, fascia and tendons at shoulder and upper arm level, right arm, initial encounter: Secondary | ICD-10-CM

## 2022-09-11 DIAGNOSIS — Z0000 Encounter for general adult medical examination without abnormal findings: Secondary | ICD-10-CM | POA: Diagnosis not present

## 2022-09-11 DIAGNOSIS — E785 Hyperlipidemia, unspecified: Secondary | ICD-10-CM

## 2022-09-11 DIAGNOSIS — Z23 Encounter for immunization: Secondary | ICD-10-CM

## 2022-09-11 DIAGNOSIS — N3090 Cystitis, unspecified without hematuria: Secondary | ICD-10-CM | POA: Diagnosis not present

## 2022-09-11 DIAGNOSIS — N951 Menopausal and female climacteric states: Secondary | ICD-10-CM | POA: Diagnosis not present

## 2022-09-11 LAB — LIPID PANEL
Cholesterol: 300 mg/dL — ABNORMAL HIGH (ref 0–200)
HDL: 43.1 mg/dL (ref >39.00)
Total CHOL/HDL Ratio: 7
Triglycerides: 456 mg/dL — ABNORMAL HIGH (ref 0.0–149.0)

## 2022-09-11 LAB — RENAL FUNCTION PANEL
Albumin: 4.2 g/dL (ref 3.5–5.2)
BUN: 26 mg/dL — ABNORMAL HIGH (ref 6–23)
CO2: 28 mEq/L (ref 19–32)
Calcium: 9.7 mg/dL (ref 8.4–10.5)
Chloride: 99 mEq/L (ref 96–112)
Creatinine, Ser: 1.35 mg/dL — ABNORMAL HIGH (ref 0.40–1.20)
GFR: 38.16 mL/min — ABNORMAL LOW (ref >60.00)
Glucose, Bld: 95 mg/dL (ref 70–99)
Phosphorus: 3.2 mg/dL (ref 2.3–4.6)
Potassium: 4.3 mEq/L (ref 3.5–5.1)
Sodium: 135 mEq/L (ref 135–145)

## 2022-09-11 LAB — CBC
HCT: 44.4 % (ref 36.0–46.0)
Hemoglobin: 15.1 g/dL — ABNORMAL HIGH (ref 12.0–15.0)
MCHC: 33.9 g/dL (ref 30.0–36.0)
MCV: 93.8 fl (ref 78.0–100.0)
Platelets: 294 10*3/uL (ref 150.0–400.0)
RBC: 4.74 Mil/uL (ref 3.87–5.11)
RDW: 13.1 % (ref 11.5–15.5)
WBC: 5.3 10*3/uL (ref 4.0–10.5)

## 2022-09-11 LAB — HEPATIC FUNCTION PANEL
ALT: 22 U/L (ref 0–35)
AST: 23 U/L (ref 0–37)
Albumin: 4.2 g/dL (ref 3.5–5.2)
Alkaline Phosphatase: 67 U/L (ref 39–117)
Bilirubin, Direct: 0.1 mg/dL (ref 0.0–0.3)
Total Bilirubin: 0.8 mg/dL (ref 0.2–1.2)
Total Protein: 7 g/dL (ref 6.0–8.3)

## 2022-09-11 LAB — VITAMIN D 25 HYDROXY (VIT D DEFICIENCY, FRACTURES): VITD: 39.55 ng/mL (ref 30.00–100.00)

## 2022-09-11 LAB — LDL CHOLESTEROL, DIRECT: Direct LDL: 150 mg/dL

## 2022-09-11 NOTE — Progress Notes (Signed)
Subjective:    Patient ID: Sierra Salinas, female    DOB: [birth year], 76 y.o.   MRN: [id number]  HPI Here for Medicare wellness visit and follow up of chronic health conditions Reviewed advanced directives Reviewed other doctors---Dr Gould--ophthal, Dr Sharyn Blitz, Dr Lenox Ponds No surgery or hospitalizations this year Not really exercising--just tries to stay active. Did walk around neighborhood--but feet got worse Vision is okay Hearing not great but right still off some. Does okay Rare alcohol No tobacco No falls No depression or anhedonia Independent with instrumental ADLs No sig memory problems  Having trouble with her right neck/towards shoulder Occasionally into right arm Goes back 3 months No known injury Taking tylenol '2000mg'$  a day Rare ibuprofen Has used heat No arm weakness  Still uses the estrogen twice a week Hasn't tried wean this year--failed before that  Continues on the dyazide for swelling Also the compression hose  Still has pain in feet Tylenol may help slightly  Last GFR 44  Had urinary symptoms over the weekend Used left over antibiotic I had given her Septra she uses for 3 days  Current Outpatient Medications on File Prior to Visit  Medication Sig Dispense Refill   diphenhydrAMINE (BENADRYL) 25 MG tablet Take 25 mg by mouth at bedtime as needed.     estradiol (ESTRACE) 0.5 MG tablet Take 1 tablet (0.5 mg total) by mouth 2 (two) times a week. 90 tablet 0   ibuprofen (ADVIL) 200 MG tablet Take 200 mg by mouth every 6 (six) hours as needed.     NON FORMULARY vitafusion     triamterene-hydrochlorothiazide (DYAZIDE) 37.5-25 MG capsule TAKE 1 CAPSULE BY MOUTH DAILY. 90 capsule 3   No current facility-administered medications on file prior to visit.    Allergies  Allergen Reactions   Nitrofurantoin     Pt denies    Past Medical History:  Diagnosis Date   Allergy    Cystitis    recurrent   Hyperlipidemia    Osteoarthritis  of multiple joints    Osteopenia    Squamous cell carcinoma 2000   right leg   Unspecified venous (peripheral) insufficiency     Past Surgical History:  Procedure Laterality Date   ABDOMINAL HYSTERECTOMY     COLONOSCOPY     NASAL SEPTUM SURGERY      Family History  Problem Relation Age of Onset   Arthritis Mother    Hypertension Mother    Ovarian cancer Maternal Aunt 85   Stroke Maternal Grandmother    Colon cancer Maternal Aunt 67   Breast cancer Neg Hx     Social History   Socioeconomic History   Marital status: Divorced    Spouse name: Not on file   Number of children: 2   Years of education: Not on file   Highest education level: Not on file  Occupational History   Occupation: Psychologist, sport and exercise    Comment: Retired  Tobacco Use   Smoking status: Former    Passive exposure: Past   Smokeless tobacco: Never  Substance and Sexual Activity   Alcohol use: Yes    Alcohol/week: 0.0 standard drinks of alcohol    Comment: occassional   Drug use: No   Sexual activity: Not on file  Other Topics Concern   Not on file  Social History Narrative   Has living will   Has designated daughter [name] to be health care POA   Would accept resuscitation attempts---no prolonged artificial ventilation   Would accept feeding  tube for limited time depending on circumstances   Social Determinants of Health   Financial Resource Strain: Not on file  Food Insecurity: Not on file  Transportation Needs: Not on file  Physical Activity: Not on file  Stress: Not on file  Social Connections: Not on file  Intimate Partner Violence: Not on file   Review of Systems Appetite is okay Weight is up slightly Sleeps okay with benedryl ---is clear headed in the morning No suspicious skin lesions Teeth okay ---keeps up with dentist Wears seat belt No heartburn or dysphagia Bowels move fine--no blood No chest pain or SOB No dizziness or syncope     Objective:   Physical  Exam Constitutional:      Appearance: Normal appearance.  HENT:     Mouth/Throat:     Comments: No lesions Eyes:     Conjunctiva/sclera: Conjunctivae normal.     Pupils: Pupils are equal, round, and reactive to light.  Cardiovascular:     Rate and Rhythm: Normal rate and regular rhythm.     Pulses: Normal pulses.     Heart sounds: No murmur heard.    No gallop.  Pulmonary:     Effort: Pulmonary effort is normal.     Breath sounds: Normal breath sounds. No wheezing or rales.  Abdominal:     Palpations: Abdomen is soft.     Tenderness: There is no abdominal tenderness.  Musculoskeletal:     Cervical back: Neck supple.     Right lower leg: No edema.     Left lower leg: No edema.     Comments: Tenderness along right trapezius  No arm weakness Normal ROM in shoulder  Lymphadenopathy:     Cervical: No cervical adenopathy.  Skin:    Findings: No lesion or rash.  Neurological:     General: No focal deficit present.     Mental Status: She is alert and oriented to person, place, and time.     Comments: Mini-cog normal  Psychiatric:        Mood and Affect: Mood normal.        Behavior: Behavior normal.            Assessment & Plan:

## 2022-09-11 NOTE — Assessment & Plan Note (Addendum)
Still estrogen twice a week--discussed trying to wean even further Yearly mammogram till 80 while on this

## 2022-09-11 NOTE — Assessment & Plan Note (Signed)
She gets hit hard with blood clots, etc when it comes on She keeps bactrim DS at home to take for 3 days when it happens (still has some left)

## 2022-09-11 NOTE — Assessment & Plan Note (Signed)
Discussed heat and topical diclofenac 

## 2022-09-11 NOTE — Assessment & Plan Note (Signed)
Controlled with the dyazide and support hose

## 2022-09-11 NOTE — Assessment & Plan Note (Signed)
Borderline to 3b last year but now rarely takes the ibuprofen Will recheck

## 2022-09-11 NOTE — Assessment & Plan Note (Signed)
I have personally reviewed the Medicare Annual Wellness questionnaire and have noted 1. The patient's medical and social history 2. Their use of alcohol, tobacco or illicit drugs 3. Their current medications and supplements 4. The patient's functional ability including ADL's, fall risks, home safety risks and hearing or visual             impairment. 5. Diet and physical activities 6. Evidence for depression or mood disorders  The patients weight, height, BMI and visual acuity have been recorded in the chart I have made referrals, counseling and provided education to the patient based review of the above and I have provided the pt with a written personalized care plan for preventive services.  I have provided you with a copy of your personalized plan for preventive services. Please take the time to review along with your updated medication list.  Done with screening colonoscopies Discussed trying at least some resistance exercise Flu vaccine today Prefers no more COVID vaccines Consider shingrix at the pharmacy

## 2022-09-11 NOTE — Assessment & Plan Note (Signed)
She will consider statin if LDL over 190

## 2022-09-11 NOTE — Progress Notes (Signed)
Hearing Screening - Comments:: Passed whisper test Vision Screening - Comments:: January 2023  

## 2022-09-11 NOTE — Patient Instructions (Signed)
Please try heat and over the counter diclofenac gel on your painful trapezius.

## 2022-09-12 LAB — PARATHYROID HORMONE, INTACT (NO CA): PTH: 48 pg/mL (ref 16–77)

## 2022-09-13 ENCOUNTER — Encounter: Payer: Self-pay | Admitting: Internal Medicine

## 2022-09-13 ENCOUNTER — Encounter: Payer: Self-pay | Admitting: *Deleted

## 2022-09-13 DIAGNOSIS — N1832 Chronic kidney disease, stage 3b: Secondary | ICD-10-CM

## 2022-09-17 ENCOUNTER — Ambulatory Visit
Admission: RE | Admit: 2022-09-17 | Discharge: 2022-09-17 | Disposition: A | Discharge status: Home / Self Care | Payer: Medicare HMO | Source: Ambulatory Visit | Attending: Internal Medicine | Admitting: Internal Medicine

## 2022-09-17 DIAGNOSIS — Z1231 Encounter for screening mammogram for malignant neoplasm of breast: Secondary | ICD-10-CM | POA: Insufficient documentation

## 2022-09-24 ENCOUNTER — Other Ambulatory Visit: Payer: Self-pay | Admitting: Family

## 2022-09-27 ENCOUNTER — Telehealth: Payer: Self-pay | Admitting: Internal Medicine

## 2022-09-27 MED ORDER — ESTRADIOL 0.5 MG PO TABS: 0.5000 mg | ORAL_TABLET | ORAL | 0 refills | Status: DC

## 2022-09-27 NOTE — Telephone Encounter (Signed)
It looks like we have not received a refill request for it. They are going to Dutch Quint, NP as she was the last person to fill it for her in Dr Alla German absence.

## 2022-09-27 NOTE — Telephone Encounter (Signed)
Patient called and stated the medication estradiol (ESTRACE) 0.5 MG tablet has not been authorized by Dr. Silvio Pate for a refill for the prescription. Call back number [phone].

## 2022-12-03 DIAGNOSIS — H18513 Endothelial corneal dystrophy, bilateral: Secondary | ICD-10-CM | POA: Diagnosis not present

## 2022-12-03 DIAGNOSIS — H25813 Combined forms of age-related cataract, bilateral: Secondary | ICD-10-CM | POA: Diagnosis not present

## 2022-12-03 DIAGNOSIS — H52203 Unspecified astigmatism, bilateral: Secondary | ICD-10-CM | POA: Diagnosis not present

## 2022-12-03 DIAGNOSIS — H5203 Hypermetropia, bilateral: Secondary | ICD-10-CM | POA: Diagnosis not present

## 2022-12-12 DIAGNOSIS — R8281 Pyuria: Secondary | ICD-10-CM | POA: Diagnosis not present

## 2022-12-12 DIAGNOSIS — R8290 Unspecified abnormal findings in urine: Secondary | ICD-10-CM | POA: Diagnosis not present

## 2022-12-12 DIAGNOSIS — N058 Unspecified nephritic syndrome with other morphologic changes: Secondary | ICD-10-CM | POA: Diagnosis not present

## 2022-12-12 DIAGNOSIS — N1832 Chronic kidney disease, stage 3b: Secondary | ICD-10-CM | POA: Diagnosis not present

## 2022-12-18 ENCOUNTER — Other Ambulatory Visit: Payer: Self-pay | Admitting: Nephrology

## 2022-12-18 DIAGNOSIS — N1832 Chronic kidney disease, stage 3b: Secondary | ICD-10-CM

## 2022-12-18 DIAGNOSIS — N048 Nephrotic syndrome with other morphologic changes: Secondary | ICD-10-CM

## 2022-12-18 DIAGNOSIS — R8290 Unspecified abnormal findings in urine: Secondary | ICD-10-CM

## 2022-12-18 DIAGNOSIS — R8281 Pyuria: Secondary | ICD-10-CM

## 2022-12-25 ENCOUNTER — Encounter: Payer: Self-pay | Admitting: Internal Medicine

## 2022-12-26 MED ORDER — SULFAMETHOXAZOLE-TRIMETHOPRIM 800-160 MG PO TABS: 1.0000 | ORAL_TABLET | Freq: Two times a day (BID) | ORAL | 2 refills | Status: DC

## 2022-12-27 DIAGNOSIS — Z0100 Encounter for examination of eyes and vision without abnormal findings: Secondary | ICD-10-CM | POA: Diagnosis not present

## 2023-01-09 ENCOUNTER — Ambulatory Visit: Payer: Medicare HMO | Admitting: Urology

## 2023-01-09 ENCOUNTER — Encounter: Payer: Self-pay | Admitting: Urology

## 2023-01-09 VITALS — BP 134/79 | HR 114 | Ht 66.0 in | Wt 177.0 lb

## 2023-01-09 DIAGNOSIS — R8281 Pyuria: Secondary | ICD-10-CM | POA: Diagnosis not present

## 2023-01-09 DIAGNOSIS — R310 Gross hematuria: Secondary | ICD-10-CM

## 2023-01-09 DIAGNOSIS — Z87898 Personal history of other specified conditions: Secondary | ICD-10-CM

## 2023-01-09 LAB — MICROSCOPIC EXAMINATION

## 2023-01-09 LAB — URINALYSIS, COMPLETE
Bilirubin, UA: NEGATIVE
Glucose, UA: NEGATIVE
Ketones, UA: NEGATIVE
Nitrite, UA: NEGATIVE
Protein,UA: NEGATIVE
RBC, UA: NEGATIVE
Specific Gravity, UA: 1.02 (ref 1.005–1.030)
Urobilinogen, Ur: 0.2 mg/dL (ref 0.2–1.0)
pH, UA: 7.5 (ref 5.0–7.5)

## 2023-01-09 LAB — BLADDER SCAN AMB NON-IMAGING

## 2023-01-09 NOTE — Patient Instructions (Signed)

## 2023-01-09 NOTE — Progress Notes (Signed)
01/09/2023 1:59 PM   Sierra Salinas [birth year] [id number]  Referring provider: Murlean Iba, MD [address],  [place] [address]  Chief Complaint  Patient presents with   New Patient (Initial Visit)    Pyruia    HPI: Sierra Salinas is a 77 y.o. female referred for evaluation of pyuria.  Recently seen by nephrology for chronic kidney disease and was noted to have pyuria with >50 WBCs. Urine culture was not ordered.  She states she did have a mild burning sensation when this occurred and took antibiotics with resolution. Scheduled for renal ultrasound as part of her evaluation for CKD In the 1990s she had a 2-year period where she would have onset of UTI symptoms which would result in gross hematuria if they were not treated within a few days.  This resolved after 2 years She had an episode last year with dysuria and gross hematuria with clots She has no baseline lower urinary tract symptoms which are bothersome    PMH: Past Medical History:  Diagnosis Date   Allergy    Cystitis    recurrent   Hyperlipidemia    Osteoarthritis of multiple joints    Osteopenia    Squamous cell carcinoma 2000   right leg   Unspecified venous (peripheral) insufficiency     Surgical History: Past Surgical History:  Procedure Laterality Date   ABDOMINAL HYSTERECTOMY     COLONOSCOPY     NASAL SEPTUM SURGERY      Home Medications:  Allergies as of 01/09/2023       Reactions   Nitrofurantoin    Pt denies        Medication List        Accurate as of January 09, 2023  1:59 PM. If you have any questions, ask your nurse or doctor.          STOP taking these medications    diphenhydrAMINE 25 MG tablet Commonly known as: BENADRYL Stopped by: Abbie Sons, MD   ibuprofen 200 MG tablet Commonly known as: ADVIL Stopped by: Abbie Sons, MD   NON FORMULARY Stopped by: Abbie Sons, MD   sulfamethoxazole-trimethoprim 800-160 MG  tablet Commonly known as: BACTRIM DS Stopped by: Abbie Sons, MD       TAKE these medications    estradiol 0.5 MG tablet Commonly known as: ESTRACE Take 1 tablet (0.5 mg total) by mouth 2 (two) times a week.   triamterene-hydrochlorothiazide 37.5-25 MG capsule Commonly known as: DYAZIDE TAKE 1 CAPSULE BY MOUTH DAILY.        Allergies:  Allergies  Allergen Reactions   Nitrofurantoin     Pt denies    Family History: Family History  Problem Relation Age of Onset   Arthritis Mother    Hypertension Mother    Ovarian cancer Maternal Aunt 80   Stroke Maternal Grandmother    Colon cancer Maternal Aunt 67   Breast cancer Neg Hx     Social History:  reports that she has quit smoking. She has been exposed to tobacco smoke. She has never used smokeless tobacco. She reports current alcohol use. She reports that she does not use drugs.   Physical Exam: BP 134/79   Pulse (!) 114   Ht '5\' 6"'$  (1.676 m)   Wt 177 lb (80.3 kg)   BMI 28.57 kg/m   Constitutional:  Alert and oriented, No acute distress. HEENT: [place] AT Respiratory: Normal respiratory effort, no increased work of  breathing. Psychiatric: Normal mood and affect.  Laboratory Data:  Urinalysis Dipstick 1+ leukocytes Microscopy 6-10 WBC    Assessment & Plan:    1. Pyuria Urine culture ordered PVR today 72 mL Schedule cystoscopy  2.  History gross hematuria CKD and renal ultrasound has been ordered by nephrology Schedule cystoscopy   Abbie Sons, [facility] [address] [place], [place] [address] [phone]

## 2023-01-12 LAB — CULTURE, URINE COMPREHENSIVE

## 2023-01-14 ENCOUNTER — Ambulatory Visit
Admission: RE | Admit: 2023-01-14 | Discharge: 2023-01-14 | Disposition: A | Discharge status: Home / Self Care | Payer: Medicare HMO | Source: Ambulatory Visit | Attending: Nephrology | Admitting: Nephrology

## 2023-01-14 DIAGNOSIS — R8281 Pyuria: Secondary | ICD-10-CM | POA: Insufficient documentation

## 2023-01-14 DIAGNOSIS — N1832 Chronic kidney disease, stage 3b: Secondary | ICD-10-CM | POA: Insufficient documentation

## 2023-01-14 DIAGNOSIS — R8290 Unspecified abnormal findings in urine: Secondary | ICD-10-CM | POA: Diagnosis not present

## 2023-01-14 DIAGNOSIS — N048 Nephrotic syndrome with other morphologic changes: Secondary | ICD-10-CM | POA: Insufficient documentation

## 2023-01-15 ENCOUNTER — Other Ambulatory Visit: Payer: Self-pay

## 2023-01-15 ENCOUNTER — Emergency Department
Admission: EM | Admit: 2023-01-15 | Discharge: 2023-01-15 | Disposition: A | Discharge status: Home / Self Care | Payer: Medicare HMO | Attending: Emergency Medicine | Admitting: Emergency Medicine

## 2023-01-15 ENCOUNTER — Emergency Department: Payer: Medicare HMO

## 2023-01-15 DIAGNOSIS — W010XXA Fall on same level from slipping, tripping and stumbling without subsequent striking against object, initial encounter: Secondary | ICD-10-CM | POA: Diagnosis not present

## 2023-01-15 DIAGNOSIS — M545 Low back pain, unspecified: Secondary | ICD-10-CM | POA: Diagnosis not present

## 2023-01-15 DIAGNOSIS — S32030A Wedge compression fracture of third lumbar vertebra, initial encounter for closed fracture: Secondary | ICD-10-CM | POA: Insufficient documentation

## 2023-01-15 MED ORDER — OXYCODONE HCL 5 MG PO TABS: 5.0000 mg | ORAL_TABLET | Freq: Three times a day (TID) | ORAL | 0 refills | Status: DC | PRN

## 2023-01-15 NOTE — ED Triage Notes (Signed)
Pt states a friends dog jumped on her and in doing so she twisted her back, resulting in pain. Pt denies falling.

## 2023-01-15 NOTE — ED Provider Notes (Signed)
   [facility] Provider Note    Event Date/Time   First MD Initiated Contact with Patient 01/15/23 1249     (approximate)   History   Back Pain   HPI  Sierra Salinas is a 77 y.o. female who presents after a fall.  Patient reports a dog jumped up on her and knocked her backwards, she is reports she twisted while she fell.  Complains of bilateral lumbar back pain, no other injuries reported.     Physical Exam   Triage Vital Signs: ED Triage Vitals  Enc Vitals Group     BP 01/15/23 1244 (!) 145/77     Pulse Rate 01/15/23 1244 90     Resp 01/15/23 1244 (!) 22     Temp 01/15/23 1245 97.7 F (36.5 C)     Temp Source 01/15/23 1245 Oral     SpO2 01/15/23 1244 99 %     Weight --      Height --      Head Circumference --      Peak Flow --      Pain Score 01/15/23 1245 8     Pain Loc --      Pain Edu? --      Excl. in [place]? --     Most recent vital signs: Vitals:   01/15/23 1244 01/15/23 1245  BP: (!) 145/77   Pulse: 90   Resp: (!) 22   Temp:  97.7 F (36.5 C)  SpO2: 99%      General: Awake, no distress.  CV:  Good peripheral perfusion.  Resp:  Normal effort.  Abd:  No distention.  Other:  Back: No vertebral tenderness palpation, mild lumbar paraspinal tenderness No lower extremity weakness, numbness or tingling, no loss of bowel or bladder continence   ED Results / Procedures / Treatments   Labs (all labs ordered are listed, but only abnormal results are displayed) Labs Reviewed - No data to display   EKG     RADIOLOGY Lumbar x-rays viewed interpreted by me, L3 compression fracture, reviewed with patient    PROCEDURES:  Critical Care performed:   Procedures   MEDICATIONS ORDERED IN ED: Medications - No data to display   IMPRESSION / MDM / [place] / ED COURSE  I reviewed the triage vital signs and the nursing notes. Patient's presentation is most consistent with acute complicated illness / injury  requiring diagnostic workup.   Patient presents after a fall as above.  Will obtain lumbar x-rays to evaluate for possible compression fracture given mechanism  Lumbar films are consistent with lumbar compression fracture, L3, discussed this with patient at length.  No neurodeficits.  Pain control laxatives if using narcotics, follow-up with neurosurgery if pain is not improving     FINAL CLINICAL IMPRESSION(S) / ED DIAGNOSES   Final diagnoses:  Compression fracture of L3 lumbar vertebra, closed, initial encounter ([facility])     Rx / DC Orders   ED Discharge Orders          Ordered    oxyCODONE (ROXICODONE) 5 MG immediate release tablet  Every 8 hours PRN        01/15/23 1341             Note:  This document was prepared using Dragon voice recognition software and may include unintentional dictation errors.   Lavonia Drafts, MD 01/15/23 1357

## 2023-01-17 ENCOUNTER — Telehealth: Payer: Self-pay | Admitting: Internal Medicine

## 2023-01-17 MED ORDER — TRAMADOL HCL 50 MG PO TABS: 50.0000 mg | ORAL_TABLET | Freq: Three times a day (TID) | ORAL | 0 refills | Status: DC | PRN

## 2023-01-17 NOTE — Telephone Encounter (Signed)
Called and spoke to pt. Advised her Dr Silvio Pate is out of the office this week and I am not sure when he will see this message. Another provider will not change meds without seeing her. She did not want to make an appt. Wanted to see what Dr Silvio Pate would do. I did suggest she try lidocaine patches OTC until she hears back from me.

## 2023-01-17 NOTE — Telephone Encounter (Signed)
Please let her know that I saw about her fall and vertebral fracture. I have sent a prescription for tramadol--which is strong but not as powerful as the oxycodone. Hopefully she can tolerate this and it will help. She should probably have an appt next week to review how things are going

## 2023-01-17 NOTE — Telephone Encounter (Signed)
Pt called stating she fell on Tues, 3/5 & visited the ER & was told she had compressed lumbar vertebrae - L3 & was prescribed oxyCODONE (ROXICODONE) 5 MG immediate release tablet by Dr. Corky Downs. Pt states when she took meds tues night, she fainted. Pt states she was alone & lied on the floor until she regained consciousness. Pt believes the dosage of the meds or the meds might be too strong. Pt stated she tried taking half the pill & didn't feel as if it helped with pain. Pt is asking is there something else Dr. Silvio Pate can prescribe for pain? Call back # [phone]

## 2023-01-17 NOTE — Addendum Note (Signed)
Addended by: Viviana Simpler I on: 01/17/2023 03:17 PM   Modules accepted: Orders

## 2023-01-18 ENCOUNTER — Encounter: Payer: Self-pay | Admitting: *Deleted

## 2023-01-18 ENCOUNTER — Telehealth: Payer: Self-pay | Admitting: *Deleted

## 2023-01-22 ENCOUNTER — Ambulatory Visit (INDEPENDENT_AMBULATORY_CARE_PROVIDER_SITE_OTHER): Payer: Medicare HMO | Admitting: Internal Medicine

## 2023-01-22 ENCOUNTER — Encounter: Payer: Self-pay | Admitting: Internal Medicine

## 2023-01-22 VITALS — BP 116/68 | HR 86 | Temp 96.8°F | Ht 66.0 in | Wt 179.0 lb

## 2023-01-22 DIAGNOSIS — S32030A Wedge compression fracture of third lumbar vertebra, initial encounter for closed fracture: Secondary | ICD-10-CM | POA: Insufficient documentation

## 2023-01-22 NOTE — Patient Instructions (Signed)
Please try senna-s 2 tabs daily along with the citrucel while you are on the pain medications

## 2023-01-22 NOTE — Assessment & Plan Note (Signed)
Significant trauma with fall after pushed by dog Pain seems more higher and likely muscular--but appearance is of acute fracture Doesn't need ortho follow up Doing okay with ADLs and discussed the time course for recovery Will recheck DEXA--since done in 2005

## 2023-01-22 NOTE — Progress Notes (Signed)
Subjective:    Patient ID: Sierra Salinas, female    DOB: Aug 02, 1946, 77 y.o.   MRN: [id number]  HPI Here for ER follow up  Was approaching steps to friend's house She opened door and her dog ran out and lunged at her and knocked her back onto concrete Immediate pain Able to get up on her own after resting a bit--onto knees and then helped up by them She did drive herself to ER  Reviewed the visit--3/5 Lumbar x-ray showed mild compression deformity of L3---consistent with acute compression fracture Didn't tolerate oxycodone I sent tramadol--and that helps some Still stiff especially in the morning Loosens up some over the day Trouble getting pants on--has to sit on bed, etc Can drive and walk once she gets up on feet Hasn't been to the grocery store--but could make it if needed  Did have a bone density many years ago---- ??osteopenia  Current Outpatient Medications on File Prior to Visit  Medication Sig Dispense Refill   traMADol (ULTRAM) 50 MG tablet Take 1-2 tablets (50-100 mg total) by mouth 3 (three) times daily as needed. 30 tablet 0   triamterene-hydrochlorothiazide (DYAZIDE) 37.5-25 MG capsule TAKE 1 CAPSULE BY MOUTH DAILY. 90 capsule 3   No current facility-administered medications on file prior to visit.    Allergies  Allergen Reactions   Nitrofurantoin     Pt denies    Past Medical History:  Diagnosis Date   Allergy    Cystitis    recurrent   Hyperlipidemia    Osteoarthritis of multiple joints    Osteopenia    Squamous cell carcinoma 2000   right leg   Unspecified venous (peripheral) insufficiency     Past Surgical History:  Procedure Laterality Date   ABDOMINAL HYSTERECTOMY     COLONOSCOPY     NASAL SEPTUM SURGERY      Family History  Problem Relation Age of Onset   Arthritis Mother    Hypertension Mother    Ovarian cancer Maternal Aunt 29   Stroke Maternal Grandmother    Colon cancer Maternal Aunt 67   Breast cancer Neg Hx     Social  History   Socioeconomic History   Marital status: Divorced    Spouse name: Not on file   Number of children: 2   Years of education: Not on file   Highest education level: Not on file  Occupational History   Occupation: Psychologist, sport and exercise    Comment: Retired  Tobacco Use   Smoking status: Former    Passive exposure: Past   Smokeless tobacco: Never  Substance and Sexual Activity   Alcohol use: Yes    Alcohol/week: 0.0 standard drinks of alcohol    Comment: occassional   Drug use: No   Sexual activity: Not on file  Other Topics Concern   Not on file  Social History Narrative   Has living will   Has designated daughter [name] to be health care POA   Would accept resuscitation attempts---no prolonged artificial ventilation   Would accept feeding tube for limited time depending on circumstances   Social Determinants of Health   Financial Resource Strain: Not on file  Food Insecurity: No Food Insecurity (01/18/2023)   Hunger Vital Sign    Worried About Running Out of Food in the Last Year: Never true    Ran Out of Food in the Last Year: Never true  Transportation Needs: No Transportation Needs (01/18/2023)   PRAPARE - Transportation    Lack of  Transportation (Medical): No    Lack of Transportation (Non-Medical): No  Physical Activity: Not on file  Stress: Not on file  Social Connections: Not on file  Intimate Partner Violence: Not on file   Review of Systems Appetite is off Some constipation---has used citrucel and it helped    Objective:   Physical Exam Musculoskeletal:     Comments: No spine tenderness Pain area is mostly up at low thoracic paraspinals  Neurological:     Comments: Transfers with stiffness but independently  Walks okay            Assessment & Plan:

## 2023-01-29 ENCOUNTER — Encounter: Payer: Self-pay | Admitting: Internal Medicine

## 2023-01-30 ENCOUNTER — Ambulatory Visit: Payer: Medicare HMO | Admitting: Internal Medicine

## 2023-01-31 ENCOUNTER — Ambulatory Visit (INDEPENDENT_AMBULATORY_CARE_PROVIDER_SITE_OTHER): Payer: Medicare HMO | Admitting: Internal Medicine

## 2023-01-31 ENCOUNTER — Encounter: Payer: Self-pay | Admitting: Internal Medicine

## 2023-01-31 VITALS — BP 124/84 | HR 83 | Temp 97.3°F | Ht 66.0 in | Wt 178.0 lb

## 2023-01-31 DIAGNOSIS — G2581 Restless legs syndrome: Secondary | ICD-10-CM | POA: Insufficient documentation

## 2023-01-31 NOTE — Progress Notes (Signed)
Subjective:    Patient ID: Sierra Salinas, female    DOB: 1945/11/30, 76 y.o.   MRN: [id number]  HPI Here due to trouble with restless legs Didn't have initial problems sleeping after the fracture--but now it is an issue  Pain is not as intense --but is aching all the time Seems to radiate around to front  Started with restless legs 3 nights ago---couldn't sleep Had to move them to feel better Not as bad the last 2 nights--did try the tramadol 2 nights ago--but not last night  Current Outpatient Medications on File Prior to Visit  Medication Sig Dispense Refill   traMADol (ULTRAM) 50 MG tablet Take 1-2 tablets (50-100 mg total) by mouth 3 (three) times daily as needed. 30 tablet 0   triamterene-hydrochlorothiazide (DYAZIDE) 37.5-25 MG capsule TAKE 1 CAPSULE BY MOUTH DAILY. 90 capsule 3   No current facility-administered medications on file prior to visit.    Allergies  Allergen Reactions   Nitrofurantoin     Pt denies    Past Medical History:  Diagnosis Date   Allergy    Cystitis    recurrent   Hyperlipidemia    Osteoarthritis of multiple joints    Osteopenia    Squamous cell carcinoma 2000   right leg   Unspecified venous (peripheral) insufficiency     Past Surgical History:  Procedure Laterality Date   ABDOMINAL HYSTERECTOMY     COLONOSCOPY     NASAL SEPTUM SURGERY      Family History  Problem Relation Age of Onset   Arthritis Mother    Hypertension Mother    Ovarian cancer Maternal Aunt 37   Stroke Maternal Grandmother    Colon cancer Maternal Aunt 67   Breast cancer Neg Hx     Social History   Socioeconomic History   Marital status: Divorced    Spouse name: Not on file   Number of children: 2   Years of education: Not on file   Highest education level: Not on file  Occupational History   Occupation: Psychologist, sport and exercise    Comment: Retired  Tobacco Use   Smoking status: Former    Passive exposure: Past   Smokeless tobacco: Never   Substance and Sexual Activity   Alcohol use: Yes    Alcohol/week: 0.0 standard drinks of alcohol    Comment: occassional   Drug use: No   Sexual activity: Not on file  Other Topics Concern   Not on file  Social History Narrative   Has living will   Has designated daughter [name] to be health care POA   Would accept resuscitation attempts---no prolonged artificial ventilation   Would accept feeding tube for limited time depending on circumstances   Social Determinants of Health   Financial Resource Strain: Not on file  Food Insecurity: No Food Insecurity (01/18/2023)   Hunger Vital Sign    Worried About Running Out of Food in the Last Year: Never true    Ran Out of Food in the Last Year: Never true  Transportation Needs: No Transportation Needs (01/18/2023)   PRAPARE - Hydrologist (Medical): No    Lack of Transportation (Non-Medical): No  Physical Activity: Not on file  Stress: Not on file  Social Connections: Not on file  Intimate Partner Violence: Not on file   Review of Systems Seems to be tolerating the tramadol--though stomach is "temperamental" Taking tylenol 1000 tid Taking senna for bowels as well as citrucel---going okay  Objective:   Physical Exam Constitutional:      Appearance: Normal appearance.  Neurological:     Mental Status: She is alert.  Psychiatric:        Mood and Affect: Mood normal.        Behavior: Behavior normal.            Assessment & Plan:

## 2023-01-31 NOTE — Assessment & Plan Note (Signed)
Seems clearly from the pain, etc Has been reluctant to take the tramadol---discussed that she should take it since it helps RLS even without the pain Suggested she take it the next 3 nights--then switch back to as needed She will stick with tylenol 1000 tid for daytime analgesia

## 2023-02-07 ENCOUNTER — Ambulatory Visit: Payer: Medicare HMO | Admitting: Orthopedic Surgery

## 2023-02-13 ENCOUNTER — Other Ambulatory Visit: Payer: Medicare HMO | Admitting: Urology

## 2023-02-28 ENCOUNTER — Encounter: Payer: Self-pay | Admitting: Urology

## 2023-02-28 ENCOUNTER — Ambulatory Visit: Payer: Medicare HMO | Admitting: Urology

## 2023-02-28 VITALS — BP 115/80 | HR 106 | Ht 66.0 in | Wt 173.0 lb

## 2023-02-28 DIAGNOSIS — Z87898 Personal history of other specified conditions: Secondary | ICD-10-CM | POA: Diagnosis not present

## 2023-02-28 DIAGNOSIS — R8281 Pyuria: Secondary | ICD-10-CM | POA: Diagnosis not present

## 2023-02-28 LAB — MICROSCOPIC EXAMINATION

## 2023-02-28 LAB — URINALYSIS, COMPLETE
Bilirubin, UA: NEGATIVE
Glucose, UA: NEGATIVE
Ketones, UA: NEGATIVE
Nitrite, UA: NEGATIVE
Protein,UA: NEGATIVE
RBC, UA: NEGATIVE
Specific Gravity, UA: 1.015 (ref 1.005–1.030)
Urobilinogen, Ur: 0.2 mg/dL (ref 0.2–1.0)
pH, UA: 7 (ref 5.0–7.5)

## 2023-02-28 NOTE — Progress Notes (Signed)
   02/28/23  CC:  Chief Complaint  Patient presents with   Cysto    HPI: Refer to my previous note 01/09/2023.  RUS performed 01/14/2023 showed no abnormalities. UA 11-30 WBC  Blood pressure 115/80, pulse (!) 106, height  (1.676 m), weight 173 lb (78.5 kg). NED. A&Ox3.   No respiratory distress   Abd soft, NT, ND Normal external genitalia with patent urethral meatus  Cystoscopy Procedure Note  Patient identification was confirmed, informed consent was obtained, and patient was prepped using Betadine solution.  Lidocaine jelly was administered per urethral meatus.    Procedure: - Flexible cystoscope introduced, without any difficulty.   - Thorough search of the bladder revealed:    normal urethral meatus    normal urothelium    no stones    no ulcers     no tumors    no urethral polyps    no trabeculation  - Ureteral orifices were normal in position and appearance.  Post-Procedure: - Patient tolerated the procedure well  Assessment/ Plan: No lower tract abnormalities RUS nml Urine cx ordered 6 month f/u     Riki Altes, MD

## 2023-03-04 LAB — CULTURE, URINE COMPREHENSIVE

## 2023-03-05 DIAGNOSIS — N058 Unspecified nephritic syndrome with other morphologic changes: Secondary | ICD-10-CM | POA: Diagnosis not present

## 2023-03-05 DIAGNOSIS — R8281 Pyuria: Secondary | ICD-10-CM | POA: Diagnosis not present

## 2023-03-05 DIAGNOSIS — N1832 Chronic kidney disease, stage 3b: Secondary | ICD-10-CM | POA: Diagnosis not present

## 2023-03-05 DIAGNOSIS — R8290 Unspecified abnormal findings in urine: Secondary | ICD-10-CM | POA: Diagnosis not present

## 2023-03-11 ENCOUNTER — Ambulatory Visit
Admission: RE | Admit: 2023-03-11 | Discharge: 2023-03-11 | Disposition: A | Discharge status: Home / Self Care | Payer: Medicare HMO | Source: Ambulatory Visit | Attending: Internal Medicine | Admitting: Internal Medicine

## 2023-03-11 DIAGNOSIS — S32030A Wedge compression fracture of third lumbar vertebra, initial encounter for closed fracture: Secondary | ICD-10-CM | POA: Insufficient documentation

## 2023-03-11 DIAGNOSIS — M8589 Other specified disorders of bone density and structure, multiple sites: Secondary | ICD-10-CM | POA: Diagnosis not present

## 2023-03-11 DIAGNOSIS — Z780 Asymptomatic menopausal state: Secondary | ICD-10-CM | POA: Diagnosis not present

## 2023-03-11 DIAGNOSIS — N3090 Cystitis, unspecified without hematuria: Secondary | ICD-10-CM | POA: Diagnosis not present

## 2023-03-11 DIAGNOSIS — R319 Hematuria, unspecified: Secondary | ICD-10-CM | POA: Diagnosis not present

## 2023-04-27 ENCOUNTER — Other Ambulatory Visit: Payer: Self-pay | Admitting: Internal Medicine

## 2023-05-17 ENCOUNTER — Encounter: Payer: Self-pay | Admitting: Primary Care

## 2023-05-17 ENCOUNTER — Ambulatory Visit: Payer: Medicare HMO | Admitting: Primary Care

## 2023-05-17 VITALS — BP 124/74 | HR 88 | Temp 97.7°F | Resp 16 | Ht 66.0 in | Wt 179.2 lb

## 2023-05-17 DIAGNOSIS — M79672 Pain in left foot: Secondary | ICD-10-CM | POA: Insufficient documentation

## 2023-05-17 LAB — CBC WITH DIFFERENTIAL/PLATELET
Basophils Absolute: 0 10*3/uL (ref 0.0–0.1)
Basophils Relative: 0.4 % (ref 0.0–3.0)
Eosinophils Absolute: 0 10*3/uL (ref 0.0–0.7)
Eosinophils Relative: 0.2 % (ref 0.0–5.0)
HCT: 45.4 % (ref 36.0–46.0)
Hemoglobin: 14.9 g/dL (ref 12.0–15.0)
Lymphocytes Relative: 22.6 % (ref 12.0–46.0)
Lymphs Abs: 1.7 10*3/uL (ref 0.7–4.0)
MCHC: 32.8 g/dL (ref 30.0–36.0)
MCV: 92.5 fl (ref 78.0–100.0)
Monocytes Absolute: 0.7 10*3/uL (ref 0.1–1.0)
Monocytes Relative: 9.5 % (ref 3.0–12.0)
Neutro Abs: 5.1 10*3/uL (ref 1.4–7.7)
Neutrophils Relative %: 67.3 % (ref 43.0–77.0)
Platelets: 302 10*3/uL (ref 150.0–400.0)
RBC: 4.9 Mil/uL (ref 3.87–5.11)
RDW: 13 % (ref 11.5–15.5)
WBC: 7.6 10*3/uL (ref 4.0–10.5)

## 2023-05-17 LAB — URIC ACID: Uric Acid, Serum: 6 mg/dL (ref 2.4–7.0)

## 2023-05-17 MED ORDER — PREDNISONE 20 MG PO TABS
ORAL_TABLET | ORAL | 0 refills | Status: DC
Start: 2023-05-17 — End: 2023-05-29

## 2023-05-17 NOTE — Patient Instructions (Signed)
Stop by the lab prior to leaving today. I will notify you of your results once received.   Start prednisone 20 mg tablets for presumed gout flare. Take 3 tablets my mouth once daily in the morning for 3 days, then 2 tablets for 3 days, then 1 tablet for 3 days.  Please update [place] if no improvement.  It was a pleasure meeting you!

## 2023-05-17 NOTE — Assessment & Plan Note (Signed)
HPI and examination today consistent for acute gout. Interesting as she has no known prior history.  We discussed common triggers for gout attacks, it does not seem that she consumes much. We also discussed medications such as her Dyazide that may trigger an attack; however, she has been on this medication for 40 years without any issues.  Checking uric acid level and CBC with differential today. Treat with prednisone course.  Start prednisone tablets. Take 3 tablets my mouth once daily in the morning for 3 days, then 2 tablets for 3 days, then 1 tablet for 3 days.  She will update if no improvement.

## 2023-05-17 NOTE — Progress Notes (Signed)
Subjective:    Patient ID: Sierra Salinas, female    DOB: January 31, 1946, 77 y.o.   MRN: [id number]  HPI  Sierra Salinas is a very pleasant 77 y.o. female patient of Dr. Alphonsus Sias with a history of chronic venous insufficiency, osteoarthritis, compression fracture of lumbar spine, CKD, plantar fasciitis, restless legs, who presents today to discuss foot pain.  Symptom onset 4 days ago with pain to the left first MTP. Since then her pain has increased with swelling. She cannot wear shoes and anything that touches the foot such as a bed sheet.   Prior to symptom onset she had meatloaf, but otherwise no red wine or shellfish.   She denies increased physical activity, injury, known history of gout. She's been applying diclofenac gel and applying ice packs without much improvement. She did take 400 mg of Ibuprofen a few nights ago with some improvement.   She is managed on Dyazide daily for hypertension, has been taking for 40 years. She does have a family history of arthritis, including rheumatoid arthritis.    Review of Systems  Constitutional:  Negative for fever.  Musculoskeletal:  Positive for arthralgias and joint swelling.  Skin:  Positive for color change.         Past Medical History:  Diagnosis Date   Allergy    Cystitis    recurrent   Hyperlipidemia    Osteoarthritis of multiple joints    Osteopenia    Squamous cell carcinoma 2000   right leg   Unspecified venous (peripheral) insufficiency     Social History   Socioeconomic History   Marital status: Divorced    Spouse name: Not on file   Number of children: 2   Years of education: Not on file   Highest education level: Not on file  Occupational History   Occupation: Manufacturing engineer    Comment: Retired  Tobacco Use   Smoking status: Former    Passive exposure: Past   Smokeless tobacco: Never  Substance and Sexual Activity   Alcohol use: Yes    Alcohol/week: 0.0 standard drinks of alcohol    Comment:  occassional   Drug use: No   Sexual activity: Not on file  Other Topics Concern   Not on file  Social History Narrative   Has living will   Has designated daughter Gaylyn Rong to be health care POA   Would accept resuscitation attempts---no prolonged artificial ventilation   Would accept feeding tube for limited time depending on circumstances   Social Determinants of Health   Financial Resource Strain: Not on file  Food Insecurity: No Food Insecurity (01/18/2023)   Hunger Vital Sign    Worried About Running Out of Food in the Last Year: Never true    Ran Out of Food in the Last Year: Never true  Transportation Needs: No Transportation Needs (01/18/2023)   PRAPARE - Administrator, Civil Service (Medical): No    Lack of Transportation (Non-Medical): No  Physical Activity: Not on file  Stress: Not on file  Social Connections: Not on file  Intimate Partner Violence: Not on file    Past Surgical History:  Procedure Laterality Date   ABDOMINAL HYSTERECTOMY     COLONOSCOPY     NASAL SEPTUM SURGERY      Family History  Problem Relation Age of Onset   Arthritis Mother    Hypertension Mother    Ovarian cancer Maternal Aunt 49   Stroke Maternal Grandmother    Colon  cancer Maternal Aunt 67   Breast cancer Neg Hx     Allergies  Allergen Reactions   Nitrofurantoin     Pt denies    Current Outpatient Medications on File Prior to Visit  Medication Sig Dispense Refill   estradiol (ESTRACE) 0.5 MG tablet Take 0.5 mg by mouth 2 (two) times a week.     triamterene-hydrochlorothiazide (DYAZIDE) 37.5-25 MG capsule TAKE 1 CAPSULE BY MOUTH EVERY DAY 90 capsule 3   No current facility-administered medications on file prior to visit.    BP 124/74   Pulse 88   Temp 97.7 F (36.5 C)   Resp 16   Ht 5\' 6"  (1.676 m)   Wt 179 lb 4 oz (81.3 kg)   SpO2 100%   BMI 28.93 kg/m  Objective:   Physical Exam Constitutional:      General: She is not in acute distress. Cardiovascular:      Rate and Rhythm: Normal rate.  Musculoskeletal:       Feet:     Comments: Moderate swelling and erythema to left first MTP joint.  Tender.  Skin:    Findings: Erythema present.  Neurological:     Mental Status: She is alert.           Assessment & Plan:  Acute foot pain, left Assessment & Plan: HPI and examination today consistent for acute gout. Interesting as she has no known prior history.  We discussed common triggers for gout attacks, it does not seem that she consumes much. We also discussed medications such as her Dyazide that may trigger an attack; however, she has been on this medication for 40 years without any issues.  Checking uric acid level and CBC with differential today. Treat with prednisone course.  Start prednisone tablets. Take 3 tablets my mouth once daily in the morning for 3 days, then 2 tablets for 3 days, then 1 tablet for 3 days.  She will update if no improvement.  Orders: -     predniSONE; Take 3 tablets my mouth once daily in the morning for 3 days, then 2 tablets for 3 days, then 1 tablet for 3 days.  Dispense: 18 tablet; Refill: 0 -     Uric acid -     CBC with Differential/Platelet        Doreene Nest, NP

## 2023-05-29 ENCOUNTER — Encounter: Payer: Self-pay | Admitting: Internal Medicine

## 2023-05-29 ENCOUNTER — Ambulatory Visit (INDEPENDENT_AMBULATORY_CARE_PROVIDER_SITE_OTHER): Payer: Medicare HMO | Admitting: Internal Medicine

## 2023-05-29 VITALS — BP 110/74 | HR 92 | Temp 97.0°F | Ht 66.0 in | Wt 181.0 lb

## 2023-05-29 DIAGNOSIS — M109 Gout, unspecified: Secondary | ICD-10-CM | POA: Insufficient documentation

## 2023-05-29 DIAGNOSIS — M10072 Idiopathic gout, left ankle and foot: Secondary | ICD-10-CM

## 2023-05-29 DIAGNOSIS — I872 Venous insufficiency (chronic) (peripheral): Secondary | ICD-10-CM

## 2023-05-29 MED ORDER — COLCHICINE 0.6 MG PO TABS: 0.6000 mg | ORAL_TABLET | Freq: Two times a day (BID) | ORAL | 1 refills | Status: DC | PRN

## 2023-05-29 NOTE — Assessment & Plan Note (Signed)
Classic presentation though uric acid wasn't high Responded to prednisone but didn't resolve Will give colchicine to use---if doesn't respond, will try prednisone again Stop the HCTZ

## 2023-05-29 NOTE — Progress Notes (Signed)
Subjective:    Patient ID: Sierra Salinas, female    DOB: [birth year], 77 y.o.   MRN: [id number]  HPI Here for ongoing toe pain  Sudden pain about 2 weeks ago and worsened over 2 days or so Left great toe Elevation and ice Tylenol didn't help. One dose of ibuprofen wasn't really helpful Was seen--presumed gout and got prednisone Severe pain went away with the first dose  Still having trouble bending it Some tenderness still--not intense like before  Current Outpatient Medications on File Prior to Visit  Medication Sig Dispense Refill   estradiol (ESTRACE) 0.5 MG tablet Take 0.5 mg by mouth 2 (two) times a week.     triamterene-hydrochlorothiazide (DYAZIDE) 37.5-25 MG capsule TAKE 1 CAPSULE BY MOUTH EVERY DAY 90 capsule 3   No current facility-administered medications on file prior to visit.    Allergies  Allergen Reactions   Nitrofurantoin     Pt denies    Past Medical History:  Diagnosis Date   Allergy    Cystitis    recurrent   Hyperlipidemia    Osteoarthritis of multiple joints    Osteopenia    Squamous cell carcinoma 2000   right leg   Unspecified venous (peripheral) insufficiency     Past Surgical History:  Procedure Laterality Date   ABDOMINAL HYSTERECTOMY     COLONOSCOPY     NASAL SEPTUM SURGERY      Family History  Problem Relation Age of Onset   Arthritis Mother    Hypertension Mother    Ovarian cancer Maternal Aunt 75   Stroke Maternal Grandmother    Colon cancer Maternal Aunt 29   Breast cancer Neg Hx     Social History   Socioeconomic History   Marital status: Divorced    Spouse name: Not on file   Number of children: 2   Years of education: Not on file   Highest education level: Not on file  Occupational History   Occupation: Manufacturing engineer    Comment: Retired  Tobacco Use   Smoking status: Former    Passive exposure: Past   Smokeless tobacco: Never  Substance and Sexual Activity   Alcohol use: Yes    Alcohol/week: 0.0  standard drinks of alcohol    Comment: occassional   Drug use: No   Sexual activity: Not on file  Other Topics Concern   Not on file  Social History Narrative   Has living will   Has designated daughter Gaylyn Rong to be health care POA   Would accept resuscitation attempts---no prolonged artificial ventilation   Would accept feeding tube for limited time depending on circumstances   Social Determinants of Health   Financial Resource Strain: Not on file  Food Insecurity: No Food Insecurity (01/18/2023)   Hunger Vital Sign    Worried About Running Out of Food in the Last Year: Never true    Ran Out of Food in the Last Year: Never true  Transportation Needs: No Transportation Needs (01/18/2023)   PRAPARE - Administrator, Civil Service (Medical): No    Lack of Transportation (Non-Medical): No  Physical Activity: Not on file  Stress: Not on file  Social Connections: Not on file  Intimate Partner Violence: Not on file   Review of Systems No history of gout Avoids shellfish and red meat. No alcohol     Objective:   Physical Exam Constitutional:      Appearance: Normal appearance.  Musculoskeletal:     Comments:  Focal redness and tenderness left 1st MTP  Neurological:     Mental Status: She is alert.            Assessment & Plan:

## 2023-05-29 NOTE — Assessment & Plan Note (Signed)
Long standing Rx with dyazide Given the gout attack--will stop this Continues with the elevation when possible and support hose If worsens, can try intermittent furosemide

## 2023-06-10 ENCOUNTER — Encounter: Payer: Self-pay | Admitting: Internal Medicine

## 2023-06-20 ENCOUNTER — Other Ambulatory Visit: Payer: Self-pay | Admitting: Internal Medicine

## 2023-07-16 DIAGNOSIS — L578 Other skin changes due to chronic exposure to nonionizing radiation: Secondary | ICD-10-CM | POA: Diagnosis not present

## 2023-07-16 DIAGNOSIS — L814 Other melanin hyperpigmentation: Secondary | ICD-10-CM | POA: Diagnosis not present

## 2023-07-16 DIAGNOSIS — L821 Other seborrheic keratosis: Secondary | ICD-10-CM | POA: Diagnosis not present

## 2023-07-16 DIAGNOSIS — D225 Melanocytic nevi of trunk: Secondary | ICD-10-CM | POA: Diagnosis not present

## 2023-07-16 DIAGNOSIS — Z86018 Personal history of other benign neoplasm: Secondary | ICD-10-CM | POA: Diagnosis not present

## 2023-07-16 DIAGNOSIS — Z85828 Personal history of other malignant neoplasm of skin: Secondary | ICD-10-CM | POA: Diagnosis not present

## 2023-09-05 ENCOUNTER — Encounter: Payer: Self-pay | Admitting: Urology

## 2023-09-05 ENCOUNTER — Ambulatory Visit: Payer: Medicare HMO | Admitting: Urology

## 2023-09-05 VITALS — BP 130/80 | HR 74 | Ht 66.0 in | Wt 178.0 lb

## 2023-09-05 DIAGNOSIS — R8281 Pyuria: Secondary | ICD-10-CM

## 2023-09-05 LAB — MICROSCOPIC EXAMINATION
Epithelial Cells (non renal): >10 /[HPF] — AB (ref 0–10)
WBC, UA: >30 /[HPF] — AB (ref 0–5)

## 2023-09-05 LAB — URINALYSIS, COMPLETE
Bilirubin, UA: NEGATIVE
Glucose, UA: NEGATIVE
Ketones, UA: NEGATIVE
Nitrite, UA: NEGATIVE
Protein,UA: NEGATIVE
RBC, UA: NEGATIVE
Specific Gravity, UA: 1.02 (ref 1.005–1.030)
Urobilinogen, Ur: 0.2 mg/dL (ref 0.2–1.0)
pH, UA: 6 (ref 5.0–7.5)

## 2023-09-05 NOTE — Progress Notes (Signed)
I, Sierra Salinas, acting as a scribe for Riki Altes, MD., have documented all relevant documentation on the behalf of Riki Altes, MD, as directed by Riki Altes, MD while in the presence of Riki Altes, MD.  09/05/2023 1:02 PM   Sierra Salinas 1946-03-03 [id number]  Referring provider: Karie Schwalbe, MD [address] [place],  [place] [address]  Chief Complaint  Patient presents with   Other   Urologic history: 1. Pyuria   2.  History gross hematuria  HPI: Sierra Salinas is a 77 y.o. female presents for follow-up visit.  Initially seen 01/09/23 for pyuria. Cystoscopy performed 02/28/2023 showed no mucosal abnormalities and renal ultrasound showed no upper tract abnormalities.  She has done well since her last visit.  She is on demand therapy prescribed by Dr. Alphonsus Sias and takes Septra x3 days at first on set of dysuria.  She did have dysuria earlier this week and had resolution of her symptoms after 1 day of Septra, which she took for 3 days.    PMH: Past Medical History:  Diagnosis Date   Allergy    Cystitis    recurrent   Hyperlipidemia    Osteoarthritis of multiple joints    Osteopenia    Squamous cell carcinoma 2000   right leg   Unspecified venous (peripheral) insufficiency     Surgical History: Past Surgical History:  Procedure Laterality Date   ABDOMINAL HYSTERECTOMY     COLONOSCOPY     NASAL SEPTUM SURGERY      Home Medications:  Allergies as of 09/05/2023       Reactions   Nitrofurantoin    Pt denies        Medication List        Accurate as of September 05, 2023  1:02 PM. If you have any questions, ask your nurse or doctor.          STOP taking these medications    colchicine 0.6 MG tablet Stopped by: Riki Altes       TAKE these medications    estradiol 0.5 MG tablet Commonly known as: ESTRACE Take 0.5 mg by mouth 2 (two) times a week.        Allergies:  Allergies  Allergen  Reactions   Nitrofurantoin     Pt denies    Family History: Family History  Problem Relation Age of Onset   Arthritis Mother    Hypertension Mother    Ovarian cancer Maternal Aunt 52   Stroke Maternal Grandmother    Colon cancer Maternal Aunt 68   Breast cancer Neg Hx     Social History:  reports that she has quit smoking. She has been exposed to tobacco smoke. She has never used smokeless tobacco. She reports current alcohol use. She reports that she does not use drugs.   Physical Exam: BP 130/80   Pulse 74   Ht 5\' 6"  (1.676 m)   Wt 178 lb (80.7 kg)   BMI 28.73 kg/m   Constitutional:  Alert and oriented, No acute distress. HEENT: [place] AT Respiratory: Normal respiratory effort, no increased work of breathing. Psychiatric: Normal mood and affect.  Urinalysis Dipstick 2+ leukocytes/microscopy >30 WBC/>10 epis   Pertinent Imaging: Ultrasound was personally reviewed and interpreted.   US RENAL  Narrative CLINICAL DATA:  ckd 3B  EXAM: RENAL / URINARY TRACT ULTRASOUND COMPLETE  COMPARISON:  None Available.  FINDINGS: Right Kidney:  Renal measurements: 9.7 x  5.0 x 5.3 cm = volume: 136 mL. Echogenicity within normal limits. No mass or hydronephrosis visualized.  Left Kidney:  Renal measurements: 8.6 x 4.3 x 3.5 cm = volume: 68 mL. Echogenicity within normal limits. No mass or hydronephrosis visualized.  Bladder:  Appears normal for degree of bladder distention.  Other:  None.  IMPRESSION: No hydronephrosis.   Electronically Signed By: Meda Klinefelter M.D. On: 01/14/2023 16:50   Assessment & Plan:    1. Pyuria Recent negative cystoscopy and renal ultrasound UA today with significant pyuria though also with significant vaginal epithelial cells.  Asymptomatic after a 3 day course of Septra; urine culture was ordered.  Follow-up as needed for worsening symptoms.   I have reviewed the above documentation for accuracy and completeness, and I  agree with the above.   Riki Altes, MD  [facility] [address] [place], [place] [address] [phone]

## 2023-09-08 LAB — CULTURE, URINE COMPREHENSIVE

## 2023-09-17 ENCOUNTER — Ambulatory Visit: Payer: Medicare HMO | Admitting: Internal Medicine

## 2023-09-17 ENCOUNTER — Encounter: Payer: Self-pay | Admitting: Internal Medicine

## 2023-09-17 VITALS — BP 120/78 | HR 81 | Temp 97.9°F | Ht 66.5 in | Wt 180.0 lb

## 2023-09-17 DIAGNOSIS — Z0000 Encounter for general adult medical examination without abnormal findings: Secondary | ICD-10-CM

## 2023-09-17 DIAGNOSIS — N1831 Chronic kidney disease, stage 3a: Secondary | ICD-10-CM

## 2023-09-17 DIAGNOSIS — M150 Primary generalized (osteo)arthritis: Secondary | ICD-10-CM | POA: Diagnosis not present

## 2023-09-17 DIAGNOSIS — N951 Menopausal and female climacteric states: Secondary | ICD-10-CM

## 2023-09-17 DIAGNOSIS — I872 Venous insufficiency (chronic) (peripheral): Secondary | ICD-10-CM | POA: Diagnosis not present

## 2023-09-17 DIAGNOSIS — M1000 Idiopathic gout, unspecified site: Secondary | ICD-10-CM | POA: Diagnosis not present

## 2023-09-17 DIAGNOSIS — Z1159 Encounter for screening for other viral diseases: Secondary | ICD-10-CM

## 2023-09-17 DIAGNOSIS — Z23 Encounter for immunization: Secondary | ICD-10-CM | POA: Diagnosis not present

## 2023-09-17 DIAGNOSIS — E785 Hyperlipidemia, unspecified: Secondary | ICD-10-CM | POA: Diagnosis not present

## 2023-09-17 LAB — CBC
HCT: 47.3 % — ABNORMAL HIGH (ref 36.0–46.0)
Hemoglobin: 15.5 g/dL — ABNORMAL HIGH (ref 12.0–15.0)
MCHC: 32.8 g/dL (ref 30.0–36.0)
MCV: 94.4 fL (ref 78.0–100.0)
Platelets: 321 10*3/uL (ref 150.0–400.0)
RBC: 5 Mil/uL (ref 3.87–5.11)
RDW: 13.4 % (ref 11.5–15.5)
WBC: 5.1 10*3/uL (ref 4.0–10.5)

## 2023-09-17 LAB — RENAL FUNCTION PANEL
Albumin: 4.2 g/dL (ref 3.5–5.2)
BUN: 21 mg/dL (ref 6–23)
CO2: 29 meq/L (ref 19–32)
Calcium: 9.7 mg/dL (ref 8.4–10.5)
Chloride: 103 meq/L (ref 96–112)
Creatinine, Ser: 1 mg/dL (ref 0.40–1.20)
GFR: 54.31 mL/min — ABNORMAL LOW (ref >60.00)
Glucose, Bld: 95 mg/dL (ref 70–99)
Phosphorus: 3 mg/dL (ref 2.3–4.6)
Potassium: 4.4 meq/L (ref 3.5–5.1)
Sodium: 139 meq/L (ref 135–145)

## 2023-09-17 LAB — HEPATIC FUNCTION PANEL
ALT: 21 U/L (ref 0–35)
AST: 23 U/L (ref 0–37)
Albumin: 4.2 g/dL (ref 3.5–5.2)
Alkaline Phosphatase: 73 U/L (ref 39–117)
Bilirubin, Direct: 0.2 mg/dL (ref 0.0–0.3)
Total Bilirubin: 0.8 mg/dL (ref 0.2–1.2)
Total Protein: 7.3 g/dL (ref 6.0–8.3)

## 2023-09-17 LAB — LIPID PANEL
Cholesterol: 246 mg/dL — ABNORMAL HIGH (ref 0–200)
HDL: 46.6 mg/dL (ref >39.00)
LDL Cholesterol: 142 mg/dL — ABNORMAL HIGH (ref 0–99)
NonHDL: 199.49
Total CHOL/HDL Ratio: 5
Triglycerides: 288 mg/dL — ABNORMAL HIGH (ref 0.0–149.0)
VLDL: 57.6 mg/dL — ABNORMAL HIGH (ref 0.0–40.0)

## 2023-09-17 LAB — VITAMIN B12: Vitamin B-12: 662 pg/mL (ref 211–911)

## 2023-09-17 LAB — TSH: TSH: 5.3 u[IU]/mL (ref 0.35–5.50)

## 2023-09-17 NOTE — Assessment & Plan Note (Signed)
Discussed statin---will not prescribe

## 2023-09-17 NOTE — Assessment & Plan Note (Signed)
Mild and doing okay even off the hydrochlorothiazide

## 2023-09-17 NOTE — Assessment & Plan Note (Signed)
Not bad Can use diclofenac topical

## 2023-09-17 NOTE — Assessment & Plan Note (Signed)
Discussed weaning the estrogen down from twice a week

## 2023-09-17 NOTE — Assessment & Plan Note (Signed)
I have personally reviewed the Medicare Annual Wellness questionnaire and have noted 1. The patient's medical and social history 2. Their use of alcohol, tobacco or illicit drugs 3. Their current medications and supplements 4. The patient's functional ability including ADL's, fall risks, home safety risks and hearing or visual             impairment. 5. Diet and physical activities 6. Evidence for depression or mood disorders  The patients weight, height, BMI and visual acuity have been recorded in the chart I have made referrals, counseling and provided education to the patient based review of the above and I have provided the pt with a written personalized care plan for preventive services.  I have provided you with a copy of your personalized plan for preventive services. Please take the time to review along with your updated medication list.  Done with colon cancer screening Yearly mammogram till 80 Discussed exercise again Flu vaccine today Shingrix and RSV at pharmacy Still prefers no COVID vaccine

## 2023-09-17 NOTE — Assessment & Plan Note (Signed)
No recurrence off the hydrochlorothiazide

## 2023-09-17 NOTE — Progress Notes (Signed)
Subjective:    Patient ID: Sierra Salinas, female    DOB: [birth year], 77 y.o.   MRN: [id number]  HPI Here for Medicare wellness visit and follow up of chronic health conditions Reviewed advanced directives Reviewed other doctors---Dr Singh--nephrology, Dr Riccardo Dubin, Dr Stoioff--urology, Dr Lynnell Dike, Dr Emmie Niemann No hospitalizations or surgery in the past year Still not really exercising Vision is okay--contacts Hearing is down in right ear--does okay though Rare glass of wine No tobacco No falls No depression or anhedonia Independent with instrumental ADLs No sig memory issues  Labs reviewed from last year GFR 38--this has shown a gradual decline Then went to nephrology and it returned normal in April (70) No longer takes ibuprofen  Ongoing intermittent tingling in feet--lasts 10 minutes then goes away No pain or burning  Intermittent edema off the hydrochlorothiazide Gone in AM No gout since being off this Continues on the support hose  Still on estrogen twice a week Hard to tell if it is still needed----but she has felt it has helped her sleep  Known hyperlipidemia Stable for a long time  No chest pain or SOB No dizziness or syncope No palpitations  Current Outpatient Medications on File Prior to Visit  Medication Sig Dispense Refill   Calcium Carbonate-Vitamin D (CALCIUM-D PO) Take by mouth.     CRANBERRY PO Take by mouth.     estradiol (ESTRACE) 0.5 MG tablet Take 0.5 mg by mouth 2 (two) times a week.     Multiple Vitamin (MULTIVITAMIN) capsule Take 1 capsule by mouth daily.     No current facility-administered medications on file prior to visit.    Allergies  Allergen Reactions   Nitrofurantoin     Pt denies    Past Medical History:  Diagnosis Date   Allergy    Cystitis    recurrent   Hyperlipidemia    Osteoarthritis of multiple joints    Osteopenia    Squamous cell carcinoma 2000   right leg   Unspecified venous  (peripheral) insufficiency     Past Surgical History:  Procedure Laterality Date   ABDOMINAL HYSTERECTOMY     COLONOSCOPY     NASAL SEPTUM SURGERY      Family History  Problem Relation Age of Onset   Arthritis Mother    Hypertension Mother    Ovarian cancer Maternal Aunt 58   Stroke Maternal Grandmother    Colon cancer Maternal Aunt 47   Breast cancer Neg Hx     Social History   Socioeconomic History   Marital status: Divorced    Spouse name: Not on file   Number of children: 2   Years of education: Not on file   Highest education level: Not on file  Occupational History   Occupation: Manufacturing engineer    Comment: Retired  Tobacco Use   Smoking status: Former    Passive exposure: Past   Smokeless tobacco: Never  Substance and Sexual Activity   Alcohol use: Yes    Alcohol/week: 0.0 standard drinks of alcohol    Comment: occassional   Drug use: No   Sexual activity: Not on file  Other Topics Concern   Not on file  Social History Narrative   Has living will   Has designated son Franne Grip to be health care POA   Would accept resuscitation attempts---no prolonged artificial ventilation   Would accept feeding tube for limited time depending on circumstances   Social Determinants of Health   Financial Resource Strain: Not on  file  Food Insecurity: No Food Insecurity (01/18/2023)   Hunger Vital Sign    Worried About Running Out of Food in the Last Year: Never true    Ran Out of Food in the Last Year: Never true  Transportation Needs: No Transportation Needs (01/18/2023)   PRAPARE - Administrator, Civil Service (Medical): No    Lack of Transportation (Non-Medical): No  Physical Activity: Not on file  Stress: Not on file  Social Connections: Not on file  Intimate Partner Violence: Not on file   Review of Systems Appetite is okay Weight is stable Sleep is variable--occ trouble initiating or staying asleep. In general --okay Wears seat belt Teeth  okay---keeps up with dentist No heartburn or dysphagia Bowels move okay--no blood No recent urinary problems No sig back or joint pains---some knee pain coming down steps No suspicious skin lesions     Objective:   Physical Exam Constitutional:      Appearance: Normal appearance.  HENT:     Mouth/Throat:     Pharynx: No oropharyngeal exudate or posterior oropharyngeal erythema.  Eyes:     Conjunctiva/sclera: Conjunctivae normal.     Pupils: Pupils are equal, round, and reactive to light.  Cardiovascular:     Rate and Rhythm: Normal rate and regular rhythm.     Pulses: Normal pulses.     Heart sounds: No murmur heard.    No gallop.  Pulmonary:     Effort: Pulmonary effort is normal.     Breath sounds: Normal breath sounds. No wheezing or rales.  Abdominal:     Palpations: Abdomen is soft.     Tenderness: There is no abdominal tenderness.  Musculoskeletal:     Cervical back: Neck supple.     Right lower leg: No edema.     Left lower leg: No edema.  Lymphadenopathy:     Cervical: No cervical adenopathy.  Skin:    Findings: No rash.  Neurological:     General: No focal deficit present.     Mental Status: She is alert and oriented to person, place, and time.     Comments: Word naming--- 8/1 minute Recall 3/3  Psychiatric:        Mood and Affect: Mood normal.        Behavior: Behavior normal.            Assessment & Plan:

## 2023-09-17 NOTE — Progress Notes (Signed)
Hearing Screening - Comments:: Passed whisper test Vision Screening - Comments:: July 2024

## 2023-09-17 NOTE — Assessment & Plan Note (Signed)
Did go up last time---?off NSAIDs Will recheck

## 2023-09-18 ENCOUNTER — Other Ambulatory Visit: Payer: Self-pay | Admitting: Internal Medicine

## 2023-09-18 LAB — HEPATITIS C ANTIBODY: Hepatitis C Ab: NONREACTIVE

## 2023-11-12 ENCOUNTER — Observation Stay
Admission: EM | Admit: 2023-11-12 | Discharge: 2023-11-14 | Disposition: A | Discharge status: Home / Self Care | Payer: Medicare HMO | Attending: General Surgery | Admitting: General Surgery

## 2023-11-12 ENCOUNTER — Emergency Department: Payer: Medicare HMO

## 2023-11-12 ENCOUNTER — Other Ambulatory Visit: Payer: Self-pay

## 2023-11-12 ENCOUNTER — Ambulatory Visit: Payer: Self-pay | Admitting: Internal Medicine

## 2023-11-12 DIAGNOSIS — K810 Acute cholecystitis: Secondary | ICD-10-CM | POA: Diagnosis not present

## 2023-11-12 DIAGNOSIS — Z79899 Other long term (current) drug therapy: Secondary | ICD-10-CM | POA: Insufficient documentation

## 2023-11-12 DIAGNOSIS — Z87891 Personal history of nicotine dependence: Secondary | ICD-10-CM | POA: Insufficient documentation

## 2023-11-12 DIAGNOSIS — K8000 Calculus of gallbladder with acute cholecystitis without obstruction: Principal | ICD-10-CM | POA: Insufficient documentation

## 2023-11-12 DIAGNOSIS — Z85828 Personal history of other malignant neoplasm of skin: Secondary | ICD-10-CM | POA: Diagnosis not present

## 2023-11-12 DIAGNOSIS — K838 Other specified diseases of biliary tract: Secondary | ICD-10-CM | POA: Diagnosis not present

## 2023-11-12 DIAGNOSIS — K828 Other specified diseases of gallbladder: Secondary | ICD-10-CM | POA: Diagnosis not present

## 2023-11-12 DIAGNOSIS — K819 Cholecystitis, unspecified: Principal | ICD-10-CM

## 2023-11-12 DIAGNOSIS — R109 Unspecified abdominal pain: Secondary | ICD-10-CM | POA: Diagnosis not present

## 2023-11-12 DIAGNOSIS — R1013 Epigastric pain: Secondary | ICD-10-CM | POA: Diagnosis not present

## 2023-11-12 LAB — CBC WITH DIFFERENTIAL/PLATELET
Abs Immature Granulocytes: 0.03 10*3/uL (ref 0.00–0.07)
Basophils Absolute: 0 10*3/uL (ref 0.0–0.1)
Basophils Relative: 0 %
Eosinophils Absolute: 0 10*3/uL (ref 0.0–0.5)
Eosinophils Relative: 0 %
HCT: 46.5 % — ABNORMAL HIGH (ref 36.0–46.0)
Hemoglobin: 15.6 g/dL — ABNORMAL HIGH (ref 12.0–15.0)
Immature Granulocytes: 0 %
Lymphocytes Relative: 16 %
Lymphs Abs: 1.8 10*3/uL (ref 0.7–4.0)
MCH: 30.8 pg (ref 26.0–34.0)
MCHC: 33.5 g/dL (ref 30.0–36.0)
MCV: 91.9 fL (ref 80.0–100.0)
Monocytes Absolute: 1.3 10*3/uL — ABNORMAL HIGH (ref 0.1–1.0)
Monocytes Relative: 11 %
Neutro Abs: 8.4 10*3/uL — ABNORMAL HIGH (ref 1.7–7.7)
Neutrophils Relative %: 73 %
Platelets: 302 10*3/uL (ref 150–400)
RBC: 5.06 MIL/uL (ref 3.87–5.11)
RDW: 12.9 % (ref 11.5–15.5)
WBC: 11.5 10*3/uL — ABNORMAL HIGH (ref 4.0–10.5)
nRBC: 0 % (ref 0.0–0.2)

## 2023-11-12 LAB — COMPREHENSIVE METABOLIC PANEL
ALT: 18 U/L (ref 0–44)
AST: 23 U/L (ref 15–41)
Albumin: 4 g/dL (ref 3.5–5.0)
Alkaline Phosphatase: 83 U/L (ref 38–126)
Anion gap: 11 (ref 5–15)
BUN: 18 mg/dL (ref 8–23)
CO2: 23 mmol/L (ref 22–32)
Calcium: 9.3 mg/dL (ref 8.9–10.3)
Chloride: 101 mmol/L (ref 98–111)
Creatinine, Ser: 0.99 mg/dL (ref 0.44–1.00)
GFR, Estimated: 59 mL/min — ABNORMAL LOW (ref >60)
Glucose, Bld: 129 mg/dL — ABNORMAL HIGH (ref 70–99)
Potassium: 4.3 mmol/L (ref 3.5–5.1)
Sodium: 135 mmol/L (ref 135–145)
Total Bilirubin: 1.2 mg/dL (ref 0.0–1.2)
Total Protein: 7.3 g/dL (ref 6.5–8.1)

## 2023-11-12 LAB — URINALYSIS, ROUTINE W REFLEX MICROSCOPIC
Bacteria, UA: NONE SEEN
Bilirubin Urine: NEGATIVE
Glucose, UA: NEGATIVE mg/dL
Hgb urine dipstick: NEGATIVE
Ketones, ur: 5 mg/dL — AB
Nitrite: NEGATIVE
Protein, ur: NEGATIVE mg/dL
Specific Gravity, Urine: 1.015 (ref 1.005–1.030)
pH: 6 (ref 5.0–8.0)

## 2023-11-12 LAB — LIPASE, BLOOD: Lipase: 29 U/L (ref 11–51)

## 2023-11-12 LAB — TROPONIN I (HIGH SENSITIVITY): Troponin I (High Sensitivity): 6 ng/L (ref <18)

## 2023-11-12 MED ORDER — SODIUM CHLORIDE 0.9 % IV SOLN: INTRAVENOUS | Status: AC

## 2023-11-12 MED ORDER — ONDANSETRON 4 MG PO TBDP: 4.0000 mg | ORAL_TABLET | Freq: Four times a day (QID) | ORAL | Status: DC | PRN

## 2023-11-12 MED ORDER — ENOXAPARIN SODIUM 40 MG/0.4ML IJ SOSY
40.0000 mg | PREFILLED_SYRINGE | INTRAMUSCULAR | Status: DC
  Administered 2023-11-12 – 2023-11-13 (×2): 40 mg via SUBCUTANEOUS
  Filled 2023-11-12 (×2): qty 0.4

## 2023-11-12 MED ORDER — MORPHINE SULFATE (PF) 4 MG/ML IV SOLN: 4.0000 mg | INTRAVENOUS | Status: DC | PRN

## 2023-11-12 MED ORDER — ONDANSETRON HCL 4 MG/2ML IJ SOLN: 4.0000 mg | Freq: Four times a day (QID) | INTRAMUSCULAR | Status: DC | PRN

## 2023-11-12 MED ORDER — HYDROCODONE-ACETAMINOPHEN 5-325 MG PO TABS: 1.0000 | ORAL_TABLET | ORAL | Status: DC | PRN

## 2023-11-12 MED ORDER — PANTOPRAZOLE SODIUM 40 MG IV SOLR
40.0000 mg | Freq: Once | INTRAVENOUS | Status: AC
  Administered 2023-11-12: 40 mg via INTRAVENOUS
  Filled 2023-11-12: qty 10

## 2023-11-12 MED ORDER — IOHEXOL 300 MG/ML  SOLN
100.0000 mL | Freq: Once | INTRAMUSCULAR | Status: AC | PRN
  Administered 2023-11-12: 100 mL via INTRAVENOUS

## 2023-11-12 MED ORDER — PIPERACILLIN-TAZOBACTAM 3.375 G IVPB 30 MIN
3.3750 g | Freq: Once | INTRAVENOUS | Status: AC
  Administered 2023-11-12: 3.375 g via INTRAVENOUS
  Filled 2023-11-12 (×2): qty 50

## 2023-11-12 MED ORDER — PIPERACILLIN-TAZOBACTAM 3.375 G IVPB
3.3750 g | Freq: Three times a day (TID) | INTRAVENOUS | Status: DC
  Administered 2023-11-12 – 2023-11-14 (×5): 3.375 g via INTRAVENOUS
  Filled 2023-11-12 (×5): qty 50

## 2023-11-12 MED ORDER — SODIUM CHLORIDE 0.9 % IV BOLUS
1000.0000 mL | Freq: Once | INTRAVENOUS | Status: AC
Start: 2023-11-12 — End: 2023-11-12
  Administered 2023-11-12: 1000 mL via INTRAVENOUS

## 2023-11-12 MED ORDER — ACETAMINOPHEN 325 MG PO TABS
650.0000 mg | ORAL_TABLET | Freq: Four times a day (QID) | ORAL | Status: DC | PRN
  Administered 2023-11-13: 650 mg via ORAL
  Filled 2023-11-12: qty 2

## 2023-11-12 MED ORDER — ACETAMINOPHEN 650 MG RE SUPP: 650.0000 mg | Freq: Four times a day (QID) | RECTAL | Status: DC | PRN

## 2023-11-12 NOTE — ED Notes (Addendum)
Pt family member called this RN over at this time. Per family member. Pt IV came out. Pt found holding arm out over bed with IV fluid leaking out onto the floor. Upon assessment of IV, IV completely out of py arm. IV removed. Will attempt to replace.

## 2023-11-12 NOTE — Telephone Encounter (Signed)
Will hold the message to check on her when we get back on Thursday 11-14-23.

## 2023-11-12 NOTE — H&P (Signed)
 SURGICAL CONSULTATION NOTE   HISTORY OF PRESENT ILLNESS (HPI):  77 y.o. female presented to [facility] ED for evaluation of abdominal pain. Patient reports she started having abdominal pains on the night.  Pain localized to the epigastric area.  No pain radiation.  Patient cannot identify any aggravating factor at the moment.  There has been no alleviating factor.  The pain improved Monday during the day but it came back Tuesday night.  She called her PCP today and triage nurse recommended to come to the ED for further evaluation.  At the ED she was found with my leukocytosis of 11.  There were no significant tenderness on palpation in the right upper quadrant.  She had an ultrasound of the abdomen that shows the bladder sludge with borderline gallbladder wall thickening.  She also had a CT scan of the abdomen and pelvis that shows thickening of the gallbladder with stranding.  Hypersal evaluated the images.  No free fluid or free air.  Normal bilirubin level.  Surgery is consulted by Dr. Ernest in this context for evaluation and management of acute cholecystitis.  PAST MEDICAL HISTORY (PMH):  Past Medical History:  Diagnosis Date   Allergy    Cystitis    recurrent   Hyperlipidemia    Osteoarthritis of multiple joints    Osteopenia    Squamous cell carcinoma 2000   right leg   Unspecified venous (peripheral) insufficiency      PAST SURGICAL HISTORY (PSH):  Past Surgical History:  Procedure Laterality Date   ABDOMINAL HYSTERECTOMY     COLONOSCOPY     NASAL SEPTUM SURGERY       MEDICATIONS:  Prior to Admission medications   Medication Sig Start Date End Date Taking? Authorizing Provider  Calcium Carbonate-Vitamin D  (CALCIUM-D PO) Take by mouth.    [provider]  CRANBERRY PO Take by mouth.    [provider]  estradiol  (ESTRACE ) 0.5 MG tablet TAKE 1 TABLET (0.5 MG TOTAL) BY MOUTH 2 (TWO) TIMES A WEEK. 09/18/23   Jimmy Charlie FERNS, MD  Multiple Vitamin (MULTIVITAMIN)  capsule Take 1 capsule by mouth daily.    [provider]     ALLERGIES:  Allergies  Allergen Reactions   Nitrofurantoin     Pt denies     SOCIAL HISTORY:  Social History   Socioeconomic History   Marital status: Divorced    Spouse name: Not on file   Number of children: 2   Years of education: Not on file   Highest education level: Not on file  Occupational History   Occupation: manufacturing engineer    Comment: Retired  Tobacco Use   Smoking status: Former    Passive exposure: Past   Smokeless tobacco: Never  Substance and Sexual Activity   Alcohol use: Yes    Alcohol/week: 0.0 standard drinks of alcohol    Comment: occassional   Drug use: No   Sexual activity: Not on file  Other Topics Concern   Not on file  Social History Narrative   Has living will   Has designated son [name] to be health care POA   Would accept resuscitation attempts---no prolonged artificial ventilation   Would accept feeding tube for limited time depending on circumstances   Social Drivers of Health   Financial Resource Strain: Not on file  Food Insecurity: No Food Insecurity (01/18/2023)   Hunger Vital Sign    Worried About Running Out of Food in the Last Year: Never true  Ran Out of Food in the Last Year: Never true  Transportation Needs: No Transportation Needs (01/18/2023)   PRAPARE - Administrator, Civil Service (Medical): No    Lack of Transportation (Non-Medical): No  Physical Activity: Not on file  Stress: Not on file  Social Connections: Not on file  Intimate Partner Violence: Not on file     FAMILY HISTORY:  Family History  Problem Relation Age of Onset   Arthritis Mother    Hypertension Mother    Ovarian cancer Maternal Aunt 21   Stroke Maternal Grandmother    Colon cancer Maternal Aunt 62   Breast cancer Neg Hx      REVIEW OF SYSTEMS:  Constitutional: denies weight loss, fever, chills, or sweats  Eyes: denies any other vision changes,  history of eye injury  ENT: denies sore throat, hearing problems  Respiratory: denies shortness of breath, wheezing  Cardiovascular: denies chest pain, palpitations  Gastrointestinal: positive abdominal pain Genitourinary: denies burning with urination or urinary frequency Musculoskeletal: denies any other joint pains or cramps  Skin: denies any other rashes or skin discolorations  Neurological: denies any other headache, dizziness, weakness  Psychiatric: denies any other depression, anxiety   All other review of systems were negative   VITAL SIGNS:  Temp:  [98.7 F (37.1 C)] 98.7 F (37.1 C) (12/31 1355) Pulse Rate:  [94-114] 94 (12/31 1607) Resp:  [16-18] 18 (12/31 1607) BP: (125-151)/(69-88) 148/81 (12/31 1607) SpO2:  [96 %-99 %] 96 % (12/31 1607) Weight:  [81.6 kg] 81.6 kg (12/31 0957)     Height: 5' 6 (167.6 cm) Weight: 81.6 kg BMI (Calculated): 29.07   INTAKE/OUTPUT:  This shift: No intake/output data recorded.  Last 2 shifts: @IOLAST2SHIFTS @   PHYSICAL EXAM:  Constitutional:  -- Normal body habitus  -- Awake, alert, and oriented x3  Eyes:  -- Pupils equally round and reactive to light  -- No scleral icterus  Ear, nose, and throat:  -- No jugular venous distension  Pulmonary:  -- No crackles  -- Equal breath sounds bilaterally -- Breathing non-labored at rest Cardiovascular:  -- S1, S2 present  -- No pericardial rubs Gastrointestinal:  -- Abdomen soft, nontender, non-distended, no guarding or rebound tenderness -- No abdominal masses appreciated, pulsatile or otherwise  Musculoskeletal and Integumentary:  -- Wounds: None appreciated -- Extremities: B/L UE and LE FROM, hands and feet warm, no edema  Neurologic:  -- Motor function: intact and symmetric -- Sensation: intact and symmetric   Labs:     Latest Ref Rng & Units 11/12/2023    9:57 AM 09/17/2023    9:36 AM 05/17/2023   11:50 AM  CBC  WBC 4.0 - 10.5 K/uL 11.5  5.1  7.6   Hemoglobin 12.0 - 15.0  g/dL 84.3  84.4  85.0   Hematocrit 36.0 - 46.0 % 46.5  47.3  45.4   Platelets 150 - 400 K/uL 302  321.0  302.0       Latest Ref Rng & Units 11/12/2023    9:57 AM 09/17/2023    9:36 AM 09/11/2022   10:25 AM  CMP  Glucose 70 - 99 mg/dL 870  95  95   BUN 8 - 23 mg/dL 18  21  26    Creatinine 0.44 - 1.00 mg/dL 9.00  8.99  8.64   Sodium 135 - 145 mmol/L 135  139  135   Potassium 3.5 - 5.1 mmol/L 4.3  4.4  4.3   Chloride 98 -  111 mmol/L 101  103  99   CO2 22 - 32 mmol/L 23  29  28    Calcium 8.9 - 10.3 mg/dL 9.3  9.7  9.7   Total Protein 6.5 - 8.1 g/dL 7.3  7.3  7.0   Total Bilirubin 0.0 - 1.2 mg/dL 1.2  0.8  0.8   Alkaline Phos 38 - 126 U/L 83  73  67   AST 15 - 41 U/L 23  23  23    ALT 0 - 44 U/L 18  21  22      Imaging studies:  EXAM: CT ABDOMEN AND PELVIS WITH CONTRAST   TECHNIQUE: Multidetector CT imaging of the abdomen and pelvis was performed using the standard protocol following bolus administration of intravenous contrast.   RADIATION DOSE REDUCTION: This exam was performed according to the departmental dose-optimization program which includes automated exposure control, adjustment of the mA and/or kV according to patient size and/or use of iterative reconstruction technique.   CONTRAST:  OMNIPAQUE  IOHEXOL  300 MG/ML  SOLN   COMPARISON:  Same day right upper quadrant abdominal ultrasound.   FINDINGS: Lower chest: No acute abnormality. There is a 3 mm nodule at the left lung base (series 4, image 36), for which no routine follow-up imaging is recommended.   Hepatobiliary: No focal liver abnormality is seen. The gallbladder is distended with wall thickening, surrounding fat stranding and trace pericholecystic fluid. No radiopaque gallstones are visualized. No biliary dilatation.   Pancreas: Unremarkable. No pancreatic ductal dilatation or surrounding inflammatory changes.   Spleen: Normal in size without focal abnormality.   Adrenals/Urinary Tract: Adrenal  glands are unremarkable. No suspicious focal lesion. No renal calculi or hydronephrosis. Bladder is unremarkable.   Stomach/Bowel: Stomach is within normal limits. Appendix appears normal. No evidence of bowel wall thickening, distention, or inflammatory changes.   Vascular/Lymphatic: The abdominal aorta is normal in caliber. Aortic atherosclerosis. No enlarged abdominal or pelvic lymph nodes.   Reproductive: Status post hysterectomy. No adnexal masses.   Other: No abdominopelvic ascites.  No intraperitoneal free air.   Musculoskeletal: Anterior wedging deformity of the L3 vertebral body with approximately 40% height loss and prominent inferior endplate Schmorl's node. Mild grade 1 retrolisthesis of L3 on L4. Degenerative disc changes at L5-S1.   IMPRESSION: 1. The gallbladder is distended with wall thickening, surrounding fat stranding and trace pericholecystic fluid. These findings are concerning for acute acalculous cholecystitis. 2. Age-indeterminate anterior wedging deformity of the L3 vertebral body with approximately 40% height loss. 3.  Aortic Atherosclerosis (ICD10-I70.0).     Electronically Signed   By: Harrietta Sherry M.D.   On: 11/12/2023 16:26  Assessment/Plan:  77 y.o. female with acute cholecystitis  Patient with history, physical exam and images consistent with acute cholecystitis. Patient oriented about diagnosis and surgical management as treatment.   Discussed the risk of surgery including post-op infxn, seroma, biloma, chronic pain, poor-delayed wound healing, retained gallstone, conversion to open procedure, post-op SBO or ileus, and need for additional procedures to address said risks.  The risks of general anesthetic including MI, CVA, sudden death or even reaction to anesthetic medications also discussed. Alternatives include continued observation.  Benefits include possible symptom relief, prevention of complications including acute cholecystitis,  pancreatitis.  Patient scheduled for surgery tomorrow morning.  Lucas Petrin, MD

## 2023-11-12 NOTE — ED Triage Notes (Signed)
 Pt to ED for abd pain started Sunday with nausea. Denies fevers. Denies urinary sx. Last bm yesterday.

## 2023-11-12 NOTE — ED Provider Triage Note (Signed)
 Emergency Medicine Provider Triage Evaluation Note  Sierra Salinas , a 77 y.o. female  was evaluated in triage.  Pt complains of epigastric pain since Sunday. Burning pain. No N/V/D. No chest pain/SOB. No history of abd surgery.  Patient Active Problem List   Diagnosis Date Noted   Gout 05/29/2023   Restless legs 01/31/2023   Trapezius strain, right, initial encounter 09/11/2022   Stage 3a chronic kidney disease (HCC) 08/15/2021   Sleep disturbance 08/07/2019   Menopausal syndrome 02/18/2015   Advance directive discussed with patient 02/18/2015   Routine general medical examination at a health care facility 10/15/2011   Osteoarthritis of multiple joints    Hyperlipemia 08/17/2009   Chronic venous insufficiency 07/23/2007   Recurrent cystitis 07/23/2007   .  Review of Systems  Positive: Epigastric pain Negative: N/v/d, fever  Physical Exam  There were no vitals taken for this visit. Gen:   Awake, no distress   Resp:  Normal effort  MSK:   Moves extremities without difficulty  Other:    Medical Decision Making  Medically screening exam initiated at 9:55 AM.  Appropriate orders placed.  Rmoni C Tegeler was informed that the remainder of the evaluation will be completed by another provider, this initial triage assessment does not replace that evaluation, and the importance of remaining in the ED until their evaluation is complete.     Amarion Portell E, PA-C 11/12/23 0958

## 2023-11-12 NOTE — Telephone Encounter (Signed)
 Copied from CRM [id number]. Topic: Clinical - Red Word Triage >> Nov 12, 2023  8:46 AM [name] wrote: Reason for CRM: Patient has symptoms of gastritis. She is nauseous and she has sever pain in her stomach.  Chief Complaint: abd pain (gastritis) Symptoms: abd pain and cannot sleep due to pain Frequency: constant Pertinent Negatives: Patient denies fever, pain radiating, vomiting Disposition: [x] ED /[] Urgent Care (no appt availability in office) / [] Appointment(In office/virtual)/ []  [facility]/ [] Home Care/ [] Refused Recommended Disposition /[] [facility]/ []  Follow-up with PCP Additional Notes: Patient c/o abd pain that started on Sunday. States pain is an 8/10.  No apt available in office today.  Instructed to go to ER.   Reason for Disposition  [1] MILD-MODERATE pain AND [2] constant AND [3] present > 2 hours  Answer Assessment - Initial Assessment Questions 1. LOCATION: Where does it hurt?      Upper abd pain 2. RADIATION: Does the pain shoot anywhere else? (e.g., chest, back)     denies 3. ONSET: When did the pain begin? (e.g., minutes, hours or days ago)      Sunday at 9 pm at night. 4. SUDDEN: Gradual or sudden onset?     sudden 5. PATTERN Does the pain come and go, or is it constant?    - If it comes and goes: How long does it last? Do you have pain now?     (Note: Comes and goes means the pain is intermittent. It goes away completely between bouts.)    - If constant: Is it getting better, staying the same, or getting worse?      (Note: Constant means the pain never goes away completely; most serious pain is constant and gets worse.)      constant 6. SEVERITY: How bad is the pain?  (e.g., Scale 1-10; mild, moderate, or severe)    - MILD (1-3): Doesn't interfere with normal activities, abdomen soft and not tender to touch.     - MODERATE (4-7): Interferes with normal activities or awakens from sleep, abdomen tender to touch.     -  SEVERE (8-10): Excruciating pain, doubled over, unable to do any normal activities.       8/10 7. RECURRENT SYMPTOM: Have you ever had this type of stomach pain before? If Yes, ask: When was the last time? and What happened that time?      no 8. CAUSE: What do you think is causing the stomach pain?     Gastritis from google. 9. RELIEVING/AGGRAVATING FACTORS: What makes it better or worse? (e.g., antacids, bending or twisting motion, bowel movement)     Taking pepto, gas ex, ginger ale, nothing touches it.  10. OTHER SYMPTOMS: Do you have any other symptoms? (e.g., back pain, diarrhea, fever, urination pain, vomiting)       denies  Protocols used: Abdominal Pain - Female-A-AH

## 2023-11-12 NOTE — ED Provider Notes (Signed)
 [facility] Provider Note    Event Date/Time   First MD Initiated Contact with Patient 11/12/23 1140     (approximate)   History   Abdominal Pain   HPI  Sierra Salinas is a 77 y.o. female who comes in with epigastric pain since Sunday and that is intermittent in nature.  Patient has a history of CKD, hyperlipidemia.  She denies any associated nausea vomiting diarrhea.  She reports the pain mostly comes on after eating and then goes away at nighttime.  Denies any pain at this time.  She denies any abdominal surgeries.  She denies any weight loss, night sweats.  Denies any chest pain, shortness of breath.   Physical Exam   Triage Vital Signs: ED Triage Vitals  Encounter Vitals Group     BP 11/12/23 0956 125/69     Systolic BP Percentile --      Diastolic BP Percentile --      Pulse Rate 11/12/23 0956 (!) 114     Resp 11/12/23 0956 16     Temp 11/12/23 0956 98.7 F (37.1 C)     Temp src --      SpO2 11/12/23 0956 96 %     Weight 11/12/23 0957 180 lb (81.6 kg)     Height 11/12/23 0957 5' 6 (1.676 m)     Head Circumference --      Peak Flow --      Pain Score 11/12/23 0957 4     Pain Loc --      Pain Education --      Exclude from Growth Chart --     Most recent vital signs: Vitals:   11/12/23 0956 11/12/23 1143  BP: 125/69 (!) 151/88  Pulse: (!) 114 (!) 102  Resp: 16 17  Temp: 98.7 F (37.1 C)   SpO2: 96% 99%     General: Awake, no distress.  CV:  Good peripheral perfusion.  Resp:  Normal effort.  Abd:  No distention.  Soft and nontender Other:     ED Results / Procedures / Treatments   Labs (all labs ordered are listed, but only abnormal results are displayed) Labs Reviewed  COMPREHENSIVE METABOLIC PANEL - Abnormal; Notable for the following components:      Result Value   Glucose, Bld 129 (*)    GFR, Estimated 59 (*)    All other components within normal limits  CBC WITH DIFFERENTIAL/PLATELET - Abnormal; Notable for the  following components:   WBC 11.5 (*)    Hemoglobin 15.6 (*)    HCT 46.5 (*)    Neutro Abs 8.4 (*)    Monocytes Absolute 1.3 (*)    All other components within normal limits  URINALYSIS, ROUTINE W REFLEX MICROSCOPIC - Abnormal; Notable for the following components:   Color, Urine YELLOW (*)    APPearance CLEAR (*)    Ketones, ur 5 (*)    Leukocytes,Ua LARGE (*)    All other components within normal limits  LIPASE, BLOOD  TROPONIN I (HIGH SENSITIVITY)      RADIOLOGY I have reviewed the ultrasound personally and interpreted + sludge   PROCEDURES:  Critical Care performed: No  Procedures   MEDICATIONS ORDERED IN ED: Medications - No data to display   IMPRESSION / MDM / ASSESSMENT AND PLAN / ED COURSE  I reviewed the triage vital signs and the nursing notes.   Patient's presentation is most consistent with acute presentation with potential threat to life  or bodily function.   Suspect most likely gastritis, GERD, gallbladder pathology.  No lower abdominal pain or pain at this time to suggest needing a CT imaging.  Considered cancer but no systemic symptoms to suggest this.   UA with some white cells in it but she denies any urinary symptoms.  Her CMP is reassuring CBC shows elevated white count.  Ultrasound was equivocal for cholecystitis.  D/w Cintron can f/u in office Thursday  This with patient and she was concerned about going home.  We discussed getting a CT to make sure we were not missing anything else going on.  CT scan showed more definitively some fluid and concerns for cholecystitis.  We discussed with Dr. Cesar who will admit patient.  Will start on Zosyn .   FINAL CLINICAL IMPRESSION(S) / ED DIAGNOSES   Final diagnoses:  Cholecystitis without calculus     Rx / DC Orders   ED Discharge Orders     None        Note:  This document was prepared using Dragon voice recognition software and may include unintentional dictation errors.   Ernest Ronal BRAVO, MD 11/12/23 [phone]

## 2023-11-13 ENCOUNTER — Encounter
Admission: EM | Disposition: A | Discharge status: Home / Self Care | Payer: Self-pay | Source: Home / Self Care | Attending: Emergency Medicine

## 2023-11-13 ENCOUNTER — Observation Stay: Payer: Self-pay | Admitting: Anesthesiology

## 2023-11-13 ENCOUNTER — Encounter: Payer: Self-pay | Admitting: General Surgery

## 2023-11-13 ENCOUNTER — Other Ambulatory Visit: Payer: Self-pay

## 2023-11-13 DIAGNOSIS — K810 Acute cholecystitis: Secondary | ICD-10-CM | POA: Diagnosis not present

## 2023-11-13 DIAGNOSIS — K8000 Calculus of gallbladder with acute cholecystitis without obstruction: Secondary | ICD-10-CM | POA: Diagnosis not present

## 2023-11-13 SURGERY — CHOLECYSTECTOMY, ROBOT-ASSISTED, LAPAROSCOPIC
Anesthesia: General | Site: Abdomen

## 2023-11-13 MED ORDER — DEXAMETHASONE SODIUM PHOSPHATE 10 MG/ML IJ SOLN
INTRAMUSCULAR | Status: DC | PRN
  Administered 2023-11-13: 10 mg via INTRAVENOUS

## 2023-11-13 MED ORDER — BUPIVACAINE-EPINEPHRINE (PF) 0.25% -1:200000 IJ SOLN
INTRAMUSCULAR | Status: AC
  Filled 2023-11-13: qty 30

## 2023-11-13 MED ORDER — KETOROLAC TROMETHAMINE 30 MG/ML IJ SOLN
INTRAMUSCULAR | Status: DC | PRN
  Administered 2023-11-13: 15 mg via INTRAVENOUS

## 2023-11-13 MED ORDER — ACETAMINOPHEN 10 MG/ML IV SOLN
INTRAVENOUS | Status: AC
  Filled 2023-11-13: qty 100

## 2023-11-13 MED ORDER — ONDANSETRON HCL 4 MG/2ML IJ SOLN
INTRAMUSCULAR | Status: AC
  Filled 2023-11-13: qty 2

## 2023-11-13 MED ORDER — FENTANYL CITRATE (PF) 100 MCG/2ML IJ SOLN
25.0000 ug | INTRAMUSCULAR | Status: DC | PRN
  Administered 2023-11-13 (×4): 25 ug via INTRAVENOUS

## 2023-11-13 MED ORDER — ROCURONIUM BROMIDE 100 MG/10ML IV SOLN
INTRAVENOUS | Status: DC | PRN
  Administered 2023-11-13: 50 mg via INTRAVENOUS

## 2023-11-13 MED ORDER — PHENYLEPHRINE 80 MCG/ML (10ML) SYRINGE FOR IV PUSH (FOR BLOOD PRESSURE SUPPORT)
PREFILLED_SYRINGE | INTRAVENOUS | Status: DC | PRN
  Administered 2023-11-13 (×2): 160 ug via INTRAVENOUS

## 2023-11-13 MED ORDER — DROPERIDOL 2.5 MG/ML IJ SOLN
0.6250 mg | Freq: Once | INTRAMUSCULAR | Status: DC | PRN
Start: 2023-11-13 — End: 2023-11-13

## 2023-11-13 MED ORDER — LIDOCAINE HCL (PF) 2 % IJ SOLN
INTRAMUSCULAR | Status: AC
  Filled 2023-11-13: qty 5

## 2023-11-13 MED ORDER — ACETAMINOPHEN 10 MG/ML IV SOLN
INTRAVENOUS | Status: DC | PRN
  Administered 2023-11-13: 1000 mg via INTRAVENOUS

## 2023-11-13 MED ORDER — DEXMEDETOMIDINE HCL IN NACL 80 MCG/20ML IV SOLN
INTRAVENOUS | Status: AC
  Filled 2023-11-13: qty 20

## 2023-11-13 MED ORDER — PROPOFOL 10 MG/ML IV BOLUS
INTRAVENOUS | Status: AC
  Filled 2023-11-13: qty 20

## 2023-11-13 MED ORDER — OXYCODONE HCL 5 MG PO TABS: 5.0000 mg | ORAL_TABLET | Freq: Once | ORAL | Status: DC

## 2023-11-13 MED ORDER — FENTANYL CITRATE (PF) 100 MCG/2ML IJ SOLN
INTRAMUSCULAR | Status: AC
  Filled 2023-11-13: qty 2

## 2023-11-13 MED ORDER — DEXAMETHASONE SODIUM PHOSPHATE 10 MG/ML IJ SOLN
INTRAMUSCULAR | Status: AC
  Filled 2023-11-13: qty 1

## 2023-11-13 MED ORDER — CHLORHEXIDINE GLUCONATE CLOTH 2 % EX PADS
6.0000 | MEDICATED_PAD | Freq: Once | CUTANEOUS | Status: AC
  Administered 2023-11-13: 6 via TOPICAL

## 2023-11-13 MED ORDER — PROPOFOL 10 MG/ML IV BOLUS
INTRAVENOUS | Status: DC | PRN
  Administered 2023-11-13: 750 mg via INTRAVENOUS

## 2023-11-13 MED ORDER — BUPIVACAINE-EPINEPHRINE 0.25% -1:200000 IJ SOLN
INTRAMUSCULAR | Status: DC | PRN
  Administered 2023-11-13: 30 mL

## 2023-11-13 MED ORDER — ONDANSETRON HCL 4 MG/2ML IJ SOLN
INTRAMUSCULAR | Status: DC | PRN
  Administered 2023-11-13: 4 mg via INTRAVENOUS

## 2023-11-13 MED ORDER — 0.9 % SODIUM CHLORIDE (POUR BTL) OPTIME
TOPICAL | Status: DC | PRN
  Administered 2023-11-13: 500 mL

## 2023-11-13 MED ORDER — INDOCYANINE GREEN 25 MG IV SOLR
1.2500 mg | Freq: Once | INTRAVENOUS | Status: AC
  Administered 2023-11-13: 5 mg via INTRAVENOUS
  Filled 2023-11-13: qty 10
  Filled 2023-11-13: qty 0.5

## 2023-11-13 MED ORDER — FENTANYL CITRATE (PF) 100 MCG/2ML IJ SOLN
INTRAMUSCULAR | Status: DC | PRN
  Administered 2023-11-13 (×4): 50 ug via INTRAVENOUS

## 2023-11-13 MED ORDER — LIDOCAINE HCL (CARDIAC) PF 100 MG/5ML IV SOSY
PREFILLED_SYRINGE | INTRAVENOUS | Status: DC | PRN
  Administered 2023-11-13: 60 mg via INTRAVENOUS

## 2023-11-13 SURGICAL SUPPLY — 47 items
BAG PRESSURE INF REUSE 1000 (BAG) IMPLANT
CANNULA REDUCER 12-8 DVNC XI (CANNULA) ×1 IMPLANT
CATH REDDICK CHOLANGI 4FR 50CM (CATHETERS) IMPLANT
CAUTERY HOOK MNPLR 1.6 DVNC XI (INSTRUMENTS) ×1 IMPLANT
CLIP LIGATING HEM O LOK PURPLE (MISCELLANEOUS) IMPLANT
CLIP LIGATING HEMO O LOK GREEN (MISCELLANEOUS) ×1 IMPLANT
DERMABOND ADVANCED .7 DNX12 (GAUZE/BANDAGES/DRESSINGS) ×1 IMPLANT
DRAPE ARM DVNC X/XI (DISPOSABLE) ×4 IMPLANT
DRAPE C-ARM XRAY 36X54 (DRAPES) IMPLANT
DRAPE COLUMN DVNC XI (DISPOSABLE) ×1 IMPLANT
ELECT REM PT RETURN 9FT ADLT (ELECTROSURGICAL) ×1
ELECTRODE REM PT RTRN 9FT ADLT (ELECTROSURGICAL) ×1 IMPLANT
FORCEPS BPLR 8 MD DVNC XI (FORCEP) ×1 IMPLANT
FORCEPS BPLR R/ABLATION 8 DVNC (INSTRUMENTS) ×1 IMPLANT
FORCEPS PROGRASP DVNC XI (FORCEP) ×1 IMPLANT
GLOVE BIO SURGEON STRL SZ 6.5 (GLOVE) ×2 IMPLANT
GLOVE BIOGEL PI IND STRL 6.5 (GLOVE) ×2 IMPLANT
GOWN STRL REUS W/ TWL LRG LVL3 (GOWN DISPOSABLE) ×3 IMPLANT
GRASPER SUT TROCAR 14GX15 (MISCELLANEOUS) ×1 IMPLANT
IRRIGATOR SUCT 8 DISP DVNC XI (IRRIGATION / IRRIGATOR) IMPLANT
IV CATH ANGIO 12GX3 LT BLUE (NEEDLE) IMPLANT
IV NS 1000ML BAXH (IV SOLUTION) IMPLANT
KIT PINK PAD W/HEAD ARE REST (MISCELLANEOUS) ×1 IMPLANT
KIT PINK PAD W/HEAD ARM REST (MISCELLANEOUS) ×1 IMPLANT
LABEL OR SOLS (LABEL) ×1 IMPLANT
MANIFOLD NEPTUNE II (INSTRUMENTS) ×1 IMPLANT
NDL HYPO 22X1.5 SAFETY MO (MISCELLANEOUS) ×1 IMPLANT
NDL INSUFFLATION 14GA 120MM (NEEDLE) ×1 IMPLANT
NEEDLE HYPO 22X1.5 SAFETY MO (MISCELLANEOUS) ×1 IMPLANT
NEEDLE INSUFFLATION 14GA 120MM (NEEDLE) ×1 IMPLANT
NS IRRIG 500ML POUR BTL (IV SOLUTION) ×1 IMPLANT
OBTURATOR OPTICAL STND 8 DVNC (TROCAR) ×1
OBTURATOR OPTICALSTD 8 DVNC (TROCAR) ×1 IMPLANT
PACK LAP CHOLECYSTECTOMY (MISCELLANEOUS) ×1 IMPLANT
SEAL UNIV 5-12 XI (MISCELLANEOUS) ×4 IMPLANT
SET TUBE SMOKE EVAC HIGH FLOW (TUBING) ×1 IMPLANT
SOL ELECTROSURG ANTI STICK (MISCELLANEOUS) ×1
SOLUTION ELECTROSURG ANTI STCK (MISCELLANEOUS) ×1 IMPLANT
SPIKE FLUID TRANSFER (MISCELLANEOUS) ×2 IMPLANT
SPONGE T-LAP 4X18 ~~LOC~~+RFID (SPONGE) IMPLANT
SUT MNCRL 4-0 27XMFL (SUTURE) ×1
SUT VICRYL 0 UR6 27IN ABS (SUTURE) ×1 IMPLANT
SUTURE MNCRL 4-0 27XMF (SUTURE) ×1 IMPLANT
SYS BAG RETRIEVAL 10MM (BASKET) ×1
SYSTEM BAG RETRIEVAL 10MM (BASKET) ×1 IMPLANT
TRAP FLUID SMOKE EVACUATOR (MISCELLANEOUS) ×1 IMPLANT
WATER STERILE IRR 500ML POUR (IV SOLUTION) ×1 IMPLANT

## 2023-11-13 NOTE — Anesthesia Postprocedure Evaluation (Signed)
 Anesthesia Post Note  Patient: MERTICE UFFELMAN  Procedure(s) Performed: XI ROBOTIC ASSISTED LAPAROSCOPIC CHOLECYSTECTOMY (Abdomen) INDOCYANINE GREEN  FLUORESCENCE IMAGING (ICG) (Abdomen)  Patient location during evaluation: PACU Anesthesia Type: General Level of consciousness: awake and alert Pain management: pain level controlled Vital Signs Assessment: post-procedure vital signs reviewed and stable Respiratory status: spontaneous breathing, nonlabored ventilation, respiratory function stable and patient connected to nasal cannula oxygen Cardiovascular status: blood pressure returned to baseline and stable Postop Assessment: no apparent nausea or vomiting Anesthetic complications: no   No notable events documented.   Last Vitals:  Vitals:   11/13/23 1203 11/13/23 1549  BP: 134/71 137/75  Pulse: 99 (!) 101  Resp: 16   Temp: 36.9 C 36.7 C  SpO2: 99% 99%    Last Pain:  Vitals:   11/13/23 1630  TempSrc:   PainSc: 2                  Prentice Murphy

## 2023-11-13 NOTE — Op Note (Signed)
 Preoperative diagnosis: Acute cholecystitis  Postoperative diagnosis: Acute cholecystitis  Procedure: Robotic Assisted Laparoscopic Cholecystectomy.   Anesthesia: GETA   Surgeon: Dr. Cesar Coe  Wound Classification: Clean Contaminated  Indications: Patient is a 78 y.o. female developed right upper quadrant and epigastric pain, nausea, leukocytosis and on workup was found to have cholelithiasis with a normal common duct and cholecystitis. Robotic Assisted Laparoscopic cholecystectomy was elected.  Findings: Severe pericholecystic edema with gallbladder thickening Critical view of safety achieved Cystic duct and artery identified, ligated and divided Adequate hemostasis    Description of procedure: The patient was placed on the operating table in the supine position. General anesthesia was induced. A time-out was completed verifying correct patient, procedure, site, positioning, and implant(s) and/or special equipment prior to beginning this procedure. An orogastric tube was placed. The abdomen was prepped and draped in the usual sterile fashion.  An incision was made in a natural skin line below the umbilicus.  The fascia was elevated and the Veress needle inserted. Proper position was confirmed by aspiration and saline meniscus test.  The abdomen was insufflated with carbon dioxide to a pressure of 15 mmHg. The patient tolerated insufflation well. A 8-mm trocar was then inserted in optiview fashion.  The laparoscope was inserted and the abdomen inspected. No injuries from initial trocar placement were noted. Additional trocars were then inserted in the following locations: an 8-mm trocar in the left lateral abdomen, and another two 8-mm trocars to the right side of the abdomen 5 cm appart. The umbilical trocar was changed to a 12 mm trocar all under direct visualization. The abdomen was inspected and no abnormalities were found. The table was placed in the reverse Trendelenburg position  with the right side up. The robotic arms were docked and target anatomy identified. Instrument inserted under direct visualization.  Abundant amount of adhesions between the gallbladder and omentum, duodenum and transverse colon were lysed with electrocautery.  This was a difficult and time-consuming dissection due to the amount of inflammation.  The dome of the gallbladder was grasped with a prograsp and retracted over the dome of the liver. The infundibulum was also grasped with an atraumatic grasper and retracted toward the right lower quadrant. This maneuver exposed Calot's triangle. The peritoneum overlying the gallbladder infundibulum was then incised and the cystic duct and cystic artery identified and circumferentially dissected. Critical view of safety reviewed before ligating any structure. Firefly images taken to visualize biliary ducts. The cystic duct and cystic artery were then doubly clipped and divided close to the gallbladder.  The gallbladder was then dissected from its peritoneal attachments by electrocautery. Hemostasis was checked and the gallbladder and contained stones were removed using an endoscopic retrieval bag. The gallbladder was passed off the table as a specimen. The gallbladder fossa was copiously irrigated with saline and hemostasis was obtained. There was no evidence of bleeding from the gallbladder fossa or cystic artery or leakage of the bile from the cystic duct stump. Secondary trocars were removed under direct vision. No bleeding was noted. The robotic arms were undoked. The scope was withdrawn and the umbilical trocar removed. The abdomen was allowed to collapse. The fascia of the 12mm trocar sites was closed with figure-of-eight 0 vicryl sutures. The skin was closed with subcuticular sutures of 4-0 monocryl and topical skin adhesive. The orogastric tube was removed.  The patient tolerated the procedure well and was taken to the postanesthesia care unit in stable condition.    Specimen: Gallbladder  Complications: None  EBL:  25 mL

## 2023-11-13 NOTE — Transfer of Care (Signed)
 Immediate Anesthesia Transfer of Care Note  Patient: Sierra Salinas  Procedure(s) Performed: XI ROBOTIC ASSISTED LAPAROSCOPIC CHOLECYSTECTOMY (Abdomen) INDOCYANINE GREEN  FLUORESCENCE IMAGING (ICG) (Abdomen)  Patient Location: PACU  Anesthesia Type:General  Level of Consciousness: awake, alert , and oriented  Airway & Oxygen Therapy: Patient Spontanous Breathing  Post-op Assessment: Report given to RN and Post -op Vital signs reviewed and stable  Post vital signs: Reviewed and stable  Last Vitals:  Vitals Value Taken Time  BP 146/79 11/13/23 0933  Temp    Pulse 104 11/13/23 0935  Resp 21 11/13/23 0935  SpO2 98 % 11/13/23 0935  Vitals shown include unfiled device data.  Last Pain:  Vitals:   11/13/23 0300  TempSrc:   PainSc: 0-No pain         Complications: No notable events documented.

## 2023-11-13 NOTE — Anesthesia Procedure Notes (Signed)
 Procedure Name: Intubation Date/Time: 11/13/2023 7:45 AM  Performed by: Erie Chock, CRNAPre-anesthesia Checklist: Patient identified, Patient being monitored, Timeout performed, Emergency Drugs available and Suction available Patient Re-evaluated:Patient Re-evaluated prior to induction Oxygen Delivery Method: Circle system utilized Preoxygenation: Pre-oxygenation with 100% oxygen Induction Type: IV induction Ventilation: Mask ventilation without difficulty Laryngoscope Size: 3 and McGrath Grade View: Grade I Tube type: Oral Tube size: 7.0 mm Number of attempts: 1 Airway Equipment and Method: Stylet Placement Confirmation: ETT inserted through vocal cords under direct vision, positive ETCO2 and breath sounds checked- equal and bilateral Secured at: 19 cm Tube secured with: Tape Dental Injury: Teeth and Oropharynx as per pre-operative assessment

## 2023-11-13 NOTE — TOC CM/SW Note (Signed)
 Transition of Care [facility]) - Inpatient Brief Assessment   Patient Details  Name: Sierra Salinas MRN: [id number] Date of Birth: 11-Oct-1946  Transition of Care [facility]) CM/SW Contact:    Corean ONEIDA Haddock, RN Phone Number: 11/13/2023, 3:00 PM   Clinical Narrative:   Transition of Care (TOC) Screening Note   Patient Details  Name: Sierra Salinas Date of Birth: [birth year]   Transition of Care [facility]) CM/SW Contact:    Corean ONEIDA Haddock, RN Phone Number: 11/13/2023, 3:00 PM    Transition of Care Department [facility]) has reviewed patient and no TOC needs have been identified at this time.. If new patient transition needs arise, please place a TOC consult.    Transition of Care Asessment: Insurance and Status: Insurance coverage has been reviewed Patient has primary care physician: Yes     Prior/Current Home Services: No current home services Social Drivers of Health Review: SDOH reviewed no interventions necessary Readmission risk has been reviewed: Yes Transition of care needs: no transition of care needs at this time

## 2023-11-13 NOTE — Interval H&P Note (Signed)
 History and Physical Interval Note:  11/13/2023 7:12 AM  Sierra Salinas  has presented today for surgery, with the diagnosis of ACUTE CHOLECYSTITIS.  The various methods of treatment have been discussed with the patient and family. After consideration of risks, benefits and other options for treatment, the patient has consented to  Procedure(s): XI ROBOTIC ASSISTED LAPAROSCOPIC CHOLECYSTECTOMY (N/A) INDOCYANINE GREEN  FLUORESCENCE IMAGING (ICG) (N/A) as a surgical intervention.  The patient's history has been reviewed, patient examined, no change in status, stable for surgery.  I have reviewed the patient's chart and labs.  Questions were answered to the patient's satisfaction.     Sierra Salinas

## 2023-11-13 NOTE — Care Management Obs Status (Signed)
 MEDICARE OBSERVATION STATUS NOTIFICATION   Patient Details  Name: Sierra Salinas MRN: [id number] Date of Birth: 04-Jul-1946   Medicare Observation Status Notification Given:  Yes    Chapman Fitch, RN 11/13/2023, 2:58 PM

## 2023-11-13 NOTE — Anesthesia Preprocedure Evaluation (Signed)
 Anesthesia Evaluation  Patient identified by MRN, date of birth, ID band Patient awake    Reviewed: Allergy & Precautions, H&P , NPO status , Patient's Chart, lab work & pertinent test results, reviewed documented beta blocker date and time   History of Anesthesia Complications Negative for: history of anesthetic complications  Airway Mallampati: III  TM Distance: >3 FB Neck ROM: full    Dental  (+) Dental Advidsory Given, Caps Permanent bridge on the lower left:   Pulmonary neg pulmonary ROS, former smoker   Pulmonary exam normal breath sounds clear to auscultation       Cardiovascular Exercise Tolerance: Good negative cardio ROS Normal cardiovascular exam Rhythm:regular Rate:Normal     Neuro/Psych negative neurological ROS  negative psych ROS   GI/Hepatic negative GI ROS, Neg liver ROS,,,  Endo/Other  negative endocrine ROS    Renal/GU CRFRenal disease  negative genitourinary   Musculoskeletal   Abdominal   Peds  Hematology negative hematology ROS (+)   Anesthesia Other Findings Past Medical History: No date: Allergy No date: Cystitis     Comment:  recurrent No date: Hyperlipidemia No date: Osteoarthritis of multiple joints No date: Osteopenia 2000: Squamous cell carcinoma     Comment:  right leg No date: Unspecified venous (peripheral) insufficiency   Reproductive/Obstetrics negative OB ROS                             Anesthesia Physical Anesthesia Plan  ASA: 2  Anesthesia Plan: General   Post-op Pain Management:    Induction: Intravenous  PONV Risk Score and Plan: 3 and Ondansetron , Dexamethasone  and Treatment may vary due to age or medical condition  Airway Management Planned: Oral ETT  Additional Equipment:   Intra-op Plan:   Post-operative Plan: Extubation in OR  Informed Consent: I have reviewed the patients History and Physical, chart, labs and discussed  the procedure including the risks, benefits and alternatives for the proposed anesthesia with the patient or authorized representative who has indicated his/her understanding and acceptance.     Dental Advisory Given  Plan Discussed with: Anesthesiologist, CRNA and Surgeon  Anesthesia Plan Comments:        Anesthesia Quick Evaluation

## 2023-11-13 NOTE — Plan of Care (Signed)
   Problem: Education: Goal: Knowledge of General Education information will improve Description Including pain rating scale, medication(s)/side effects and non-pharmacologic comfort measures Outcome: Progressing   Problem: Health Behavior/Discharge Planning: Goal: Ability to manage health-related needs will improve Outcome: Progressing

## 2023-11-14 ENCOUNTER — Telehealth: Payer: Self-pay | Admitting: Internal Medicine

## 2023-11-14 NOTE — Progress Notes (Signed)
 Ivs removed, Discharge education completed. Patient verbalized understanding. Family assisting patient to get dressed.  [name]

## 2023-11-14 NOTE — Telephone Encounter (Signed)
 Received CRM stating pt reqested a call back from [place]? Pt states Carollee Herter was suppose to f/u with her regarding her galbladder removal. Call back # [phone]

## 2023-11-14 NOTE — Discharge Instructions (Signed)

## 2023-11-14 NOTE — Telephone Encounter (Signed)
 Pt had gallbladder removed 11-12-23. I left a message to see how she was doing and that we were glad she went to the ER to be evaluated.

## 2023-11-14 NOTE — Plan of Care (Signed)

## 2023-11-14 NOTE — Discharge Summary (Signed)
  Patient ID: Sierra Salinas MRN: [id number] DOB/AGE: [birth year] 78 y.o.  Admit date: 11/12/2023 Discharge date: 11/14/2023   Discharge Diagnoses:  Principal Problem:   Acute cholecystitis   Procedures: Robotic assist laparoscopic cholecystectomy  Hospital Course: Patient admitted with acute cholecystitis.  She was treated with surgical intervention with cholecystectomy.  She tolerated the procedure well.  Patient has tolerated soft diet.  The pain is controlled with Tylenol .  Patient is able to ambulate.  Incisions dry and clean.  Physical Exam Vitals reviewed.  Constitutional:      Appearance: She is well-developed.  HENT:     Head: Normocephalic.  Cardiovascular:     Rate and Rhythm: Normal rate and regular rhythm.  Pulmonary:     Effort: Pulmonary effort is normal.     Breath sounds: Normal breath sounds.  Abdominal:     General: Abdomen is flat. Bowel sounds are normal. There is no distension.     Palpations: Abdomen is soft.     Tenderness: There is no abdominal tenderness.  Skin:    General: Skin is warm.     Capillary Refill: Capillary refill takes less than 2 seconds.  Neurological:     Mental Status: She is alert and oriented to person, place, and time.      Consults: None  Disposition: Discharge disposition: 01-Home or Self Care       Discharge Instructions     Diet - low sodium heart healthy   Complete by: As directed    Increase activity slowly   Complete by: As directed       Allergies as of 11/14/2023       Reactions   Nitrofurantoin    Pt denies        Medication List     TAKE these medications    CALCIUM-D PO Take by mouth.   CRANBERRY PO Take by mouth.   estradiol  0.5 MG tablet Commonly known as: ESTRACE  TAKE 1 TABLET (0.5 MG TOTAL) BY MOUTH 2 (TWO) TIMES A WEEK.   multivitamin capsule Take 1 capsule by mouth daily.        Follow-up Information     Rodolph Romano, MD Follow up in 2 week(s).   Specialty:  General Surgery Why: Follow up after cholecystectomy Contact information: [address] [place] [address] [phone]

## 2023-11-14 NOTE — Telephone Encounter (Signed)
 Left message to call office

## 2023-11-15 LAB — SURGICAL PATHOLOGY

## 2023-11-26 ENCOUNTER — Other Ambulatory Visit: Payer: Self-pay | Admitting: Internal Medicine

## 2023-11-26 DIAGNOSIS — Z1231 Encounter for screening mammogram for malignant neoplasm of breast: Secondary | ICD-10-CM

## 2023-12-17 DIAGNOSIS — H25813 Combined forms of age-related cataract, bilateral: Secondary | ICD-10-CM | POA: Diagnosis not present

## 2023-12-17 DIAGNOSIS — H5203 Hypermetropia, bilateral: Secondary | ICD-10-CM | POA: Diagnosis not present

## 2023-12-22 ENCOUNTER — Other Ambulatory Visit: Payer: Self-pay | Admitting: Internal Medicine

## 2023-12-23 ENCOUNTER — Ambulatory Visit
Admission: RE | Admit: 2023-12-23 | Discharge: 2023-12-23 | Disposition: A | Discharge status: Home / Self Care | Payer: Medicare HMO | Source: Ambulatory Visit | Attending: Internal Medicine | Admitting: Internal Medicine

## 2023-12-23 DIAGNOSIS — Z1231 Encounter for screening mammogram for malignant neoplasm of breast: Secondary | ICD-10-CM | POA: Insufficient documentation

## 2023-12-25 ENCOUNTER — Encounter: Payer: Self-pay | Admitting: Internal Medicine

## 2024-01-23 ENCOUNTER — Ambulatory Visit: Payer: Self-pay | Admitting: Internal Medicine

## 2024-01-23 NOTE — Telephone Encounter (Signed)
  Chief Complaint: itching on back Symptoms: itching Frequency: 4 days Pertinent Negatives: numbness, weakness, pain with urinating, pain, rash, fever Disposition: [] ED /[] Urgent Care (no appt availability in office) / [x] Appointment(In office/virtual)/ []  [facility]/ [] Home Care/ [] Refused Recommended Disposition /[] [facility]/ []  Follow-up with PCP Additional Notes: Patient calls reporting mid back itching that began 4 days ago. Patient states that she felt some pain in the area initially but that resolved on it's own and now it feels like itching. She is concerned she has shingles, but denies rash in that area. Per protocol, patient to be evaluated within 24 hours. First available appointment with PCP outside of guidelines. Patient scheduled with first available provider in clinic for 01/24/24 @ 1100- patient requested that time stating earlier appts were too early in the morning. Care advice reviewed, patient verbalized understanding and denies further questions at this time. Alerting PCP for review.    Copied from CRM [id number]. Topic: Clinical - Red Word Triage >> Jan 23, 2024  8:10 AM [place] A wrote: [facility] that prompted transfer to Nurse Triage: Patient thinks she has shingles-has tingling,itchy and some pain-started a couple of days ago Reason for Disposition  Rash in same area as pain (may be described as "small blisters")  Answer Assessment - Initial Assessment Questions 1. ONSET: "When did the pain begin?"      4 days 2. LOCATION: "Where does it hurt?" (upper, mid or lower back)     Mid back 3. SEVERITY: "How bad is the pain?"  (e.g., Scale 1-10; mild, moderate, or severe)   - MILD (1-3): Doesn't interfere with normal activities.    - MODERATE (4-7): Interferes with normal activities or awakens from sleep.    - SEVERE (8-10): Excruciating pain, unable to do any normal activities.      Denies at this time 4. PATTERN: "Is the pain constant?" (e.g., yes,  no; constant, intermittent)      Denies pain at this time 5. RADIATION: "Does the pain shoot into your legs or somewhere else?"     States it is more of an itch, does not move stays in one place 6. CAUSE:  "What do you think is causing the back pain?"      Shingles 7. BACK OVERUSE:  "Any recent lifting of heavy objects, strenuous work or exercise?"     Denies 8. MEDICINES: "What have you taken so far for the pain?" (e.g., nothing, acetaminophen, NSAIDS)     OTC medication relieved pain 9. NEUROLOGIC SYMPTOMS: "Do you have any weakness, numbness, or problems with bowel/bladder control?"     Denies 10. OTHER SYMPTOMS: "Do you have any other symptoms?" (e.g., fever, abdomen pain, burning with urination, blood in urine)       Denies  Protocols used: Back Pain-A-AH

## 2024-01-23 NOTE — Telephone Encounter (Signed)
 Noted--glad she can be seen

## 2024-01-23 NOTE — Telephone Encounter (Signed)
Aware, will see her then 

## 2024-01-24 ENCOUNTER — Ambulatory Visit: Admitting: Family Medicine

## 2024-04-14 DIAGNOSIS — H903 Sensorineural hearing loss, bilateral: Secondary | ICD-10-CM | POA: Diagnosis not present

## 2024-04-14 DIAGNOSIS — H6121 Impacted cerumen, right ear: Secondary | ICD-10-CM | POA: Diagnosis not present

## 2024-05-13 ENCOUNTER — Ambulatory Visit (INDEPENDENT_AMBULATORY_CARE_PROVIDER_SITE_OTHER): Admitting: Internal Medicine

## 2024-05-13 ENCOUNTER — Encounter: Payer: Self-pay | Admitting: Internal Medicine

## 2024-05-13 VITALS — BP 104/60 | HR 96 | Temp 97.9°F | Ht 66.5 in | Wt 181.0 lb

## 2024-05-13 DIAGNOSIS — U099 Post covid-19 condition, unspecified: Secondary | ICD-10-CM | POA: Diagnosis not present

## 2024-05-13 NOTE — Progress Notes (Signed)
 Subjective:    Patient ID: Sierra Salinas, female    DOB: 1946/08/08, 78 y.o.   MRN: [id number]  HPI Here due to persistent symptoms since COVID infection last month  Started not feeling right 4-5 weeks ago---fatigue, not up to par Suspected COVID 6/14--did test at home. Was positive Low grade fevers, chills---tylenol  some help Feeling weak No cough, sore throat, head congestion, trouble breathing Mostly fatigue and reduced appetite  Slight improvement--but still not back to normal Limited appetite and choices  Taste off some  Current Outpatient Medications on File Prior to Visit  Medication Sig Dispense Refill   Calcium Carbonate-Vitamin D  (CALCIUM-D PO) Take by mouth.     CRANBERRY PO Take by mouth.     Cyanocobalamin  (B-12 PO) Take by mouth.     estradiol  (ESTRACE ) 0.5 MG tablet TAKE 1 TABLET (0.5 MG TOTAL) BY MOUTH 2 (TWO) TIMES A WEEK. 24 tablet 4   Multiple Vitamin (MULTIVITAMIN) capsule Take 1 capsule by mouth daily.     No current facility-administered medications on file prior to visit.    Allergies  Allergen Reactions   Nitrofurantoin     Pt denies    Past Medical History:  Diagnosis Date   Allergy    Cystitis    recurrent   Hyperlipidemia    Osteoarthritis of multiple joints    Osteopenia    Squamous cell carcinoma 2000   right leg   Unspecified venous (peripheral) insufficiency     Past Surgical History:  Procedure Laterality Date   ABDOMINAL HYSTERECTOMY     COLONOSCOPY     NASAL SEPTUM SURGERY      Family History  Problem Relation Age of Onset   Arthritis Mother    Hypertension Mother    Ovarian cancer Maternal Aunt 80   Stroke Maternal Grandmother    Colon cancer Maternal Aunt 46   Breast cancer Neg Hx     Social History   Socioeconomic History   Marital status: Divorced    Spouse name: Not on file   Number of children: 2   Years of education: Not on file   Highest education level: Not on file  Occupational History    Occupation: Manufacturing engineer    Comment: Retired  Tobacco Use   Smoking status: Former    Passive exposure: Past   Smokeless tobacco: Never  Substance and Sexual Activity   Alcohol use: Yes    Alcohol/week: 0.0 standard drinks of alcohol    Comment: occassional   Drug use: No   Sexual activity: Not on file  Other Topics Concern   Not on file  Social History Narrative   Has living will   Has designated son [name] to be health care POA   Would accept resuscitation attempts---no prolonged artificial ventilation   Would accept feeding tube for limited time depending on circumstances   Social Drivers of Health   Financial Resource Strain: Not on file  Food Insecurity: No Food Insecurity (11/13/2023)   Hunger Vital Sign    Worried About Running Out of Food in the Last Year: Never true    Ran Out of Food in the Last Year: Never true  Transportation Needs: No Transportation Needs (11/13/2023)   PRAPARE - Administrator, Civil Service (Medical): No    Lack of Transportation (Non-Medical): No  Physical Activity: Not on file  Stress: Not on file  Social Connections: Moderately Integrated (11/13/2023)   Social Connection and Isolation Panel  Frequency of Communication with Friends and Family: Never    Frequency of Social Gatherings with Friends and Family: More than three times a week    Attends Religious Services: More than 4 times per year    Active Member of [employer] or Organizations: Yes    Attends Banker Meetings: More than 4 times per year    Marital Status: Widowed  Intimate Partner Violence: Not At Risk (11/13/2023)   Humiliation, Afraid, Rape, and Kick questionnaire    Fear of Current or Ex-Partner: No    Emotionally Abused: No    Physically Abused: No    Sexually Abused: No   Review of Systems Has lost 5# or so Sleeping more than usual Still able to do her shopping and housework but gets worn out easier    Objective:   Physical  Exam Constitutional:      Appearance: Normal appearance.  HENT:     Head:     Comments: No sinus tenderness    Right Ear: Tympanic membrane and ear canal normal.     Left Ear: Tympanic membrane and ear canal normal.     Mouth/Throat:     Pharynx: No oropharyngeal exudate or posterior oropharyngeal erythema.  Cardiovascular:     Rate and Rhythm: Normal rate and regular rhythm.     Heart sounds: No murmur heard.    No gallop.  Pulmonary:     Effort: Pulmonary effort is normal.     Breath sounds: Normal breath sounds. No wheezing or rales.  Musculoskeletal:     Cervical back: Neck supple.  Lymphadenopathy:     Cervical: No cervical adenopathy.  Neurological:     Mental Status: She is alert.            Assessment & Plan:

## 2024-05-13 NOTE — Assessment & Plan Note (Signed)
 Reassured that the time course is still consistent with progressive improvement back to normal in the near future Nothing worrisome now Discussed---not infectious now If ongoing issues, would refer for further evaluation (?pulmonary)

## 2024-05-25 ENCOUNTER — Ambulatory Visit: Payer: Self-pay

## 2024-05-25 NOTE — Telephone Encounter (Signed)
 FYI Only or Action Required?: FYI only for provider.  Patient was last seen in primary care on 05/13/2024 by Jimmy Charlie FERNS, MD.  Called Nurse Triage reporting Abdominal Pain.  Symptoms began about a month ago.  Interventions attempted: OTC medications: Tylenol .  Symptoms are: gradually worsening.  Triage Disposition: See Physician Within 24 Hours  Patient/caregiver understands and will follow disposition?: Yes    Copied from CRM [id number]. Topic: Clinical - Red Word Triage >> May 25, 2024  8:20 AM Sierra Salinas wrote: Red Word that prompted transfer to Nurse Triage: Pain in left side since mid-June. Reason for Disposition  [1] MILD pain (e.g., does not interfere with normal activities) AND [2] pain comes and goes (cramps) AND [3] present > 48 hours  (Exception: This same abdominal pain is a chronic symptom recurrent or ongoing AND present > 4 weeks.)  Answer Assessment - Initial Assessment Questions Patient had gallbladder removal surgery on January 1 and she recently recovered from Covid. Patient states pain feels worse when she takes a deep breath in, but definitively stated it is not in the lung/rib area.. it is in the left abdominal area.    1. LOCATION: Where does it hurt?      Left side of abdomen. Patient states it feels where pancrease is.   2. RADIATION: Does the pain shoot anywhere else? (e.g., chest, back)     Patient states it's radiating around to the back on the left side  3. ONSET: When did the pain begin? (e.g., minutes, hours or days ago)      Mid June  4. SUDDEN: Gradual or sudden onset?     Gradual   5. PATTERN Does the pain come and go, or is it constant?     Comes and goes  6. SEVERITY: How bad is the pain?  (e.g., Scale 1-10; mild, moderate, or severe)     Patient states every time she takes a deep breath, she can feel a nagging pain. She states it is not an intense pain but it is something that shouldn't be there.  7. RECURRENT SYMPTOM:  Have you ever had this type of stomach pain before? If Yes, ask: When was the last time? and What happened that time?      N/a  8. CAUSE: What do you think is causing the stomach pain? (e.g., gallstones, recent abdominal surgery)     Patient is uncertain. She initially thought it was related to her long-haul covid but states it is not improving  9. RELIEVING/AGGRAVATING FACTORS: What makes it better or worse? (e.g., antacids, bending or twisting motion, bowel movement)     Aggravating factor = taking a deep breath in Patient states she has been taking a lot of tylenol  for the muscle pain in her neck so it may be masking the symptoms.  10. OTHER SYMPTOMS: Do you have any other symptoms? (e.g., back pain, diarrhea, fever, urination pain, vomiting)       Still having fatigue  Denies changes in bowel, urination, nausea, vomiting, fever  Protocols used: Abdominal Pain - Female-A-AH

## 2024-05-26 ENCOUNTER — Ambulatory Visit
Admission: RE | Admit: 2024-05-26 | Discharge: 2024-05-26 | Disposition: A | Discharge status: Home / Self Care | Attending: Family Medicine | Admitting: Family Medicine

## 2024-05-26 ENCOUNTER — Ambulatory Visit: Payer: Self-pay | Admitting: Family Medicine

## 2024-05-26 ENCOUNTER — Ambulatory Visit
Admission: RE | Admit: 2024-05-26 | Discharge: 2024-05-26 | Disposition: A | Discharge status: Home / Self Care | Source: Ambulatory Visit | Attending: Family Medicine | Admitting: Family Medicine

## 2024-05-26 ENCOUNTER — Encounter: Payer: Self-pay | Admitting: Family Medicine

## 2024-05-26 ENCOUNTER — Ambulatory Visit (INDEPENDENT_AMBULATORY_CARE_PROVIDER_SITE_OTHER): Admitting: Family Medicine

## 2024-05-26 VITALS — BP 142/86 | HR 86 | Temp 98.4°F | Ht 66.5 in | Wt 180.2 lb

## 2024-05-26 DIAGNOSIS — R109 Unspecified abdominal pain: Secondary | ICD-10-CM

## 2024-05-26 DIAGNOSIS — U099 Post covid-19 condition, unspecified: Secondary | ICD-10-CM

## 2024-05-26 DIAGNOSIS — R5382 Chronic fatigue, unspecified: Secondary | ICD-10-CM

## 2024-05-26 DIAGNOSIS — R5383 Other fatigue: Secondary | ICD-10-CM | POA: Insufficient documentation

## 2024-05-26 DIAGNOSIS — M25519 Pain in unspecified shoulder: Secondary | ICD-10-CM

## 2024-05-26 DIAGNOSIS — R8290 Unspecified abnormal findings in urine: Secondary | ICD-10-CM

## 2024-05-26 DIAGNOSIS — R10A2 Flank pain, left side: Secondary | ICD-10-CM | POA: Insufficient documentation

## 2024-05-26 LAB — POC URINALSYSI DIPSTICK (AUTOMATED)
Bilirubin, UA: NEGATIVE
Blood, UA: NEGATIVE
Glucose, UA: NEGATIVE
Ketones, UA: NEGATIVE
Nitrite, UA: NEGATIVE
Protein, UA: NEGATIVE
Spec Grav, UA: 1.01 (ref 1.010–1.025)
Urobilinogen, UA: 0.2 U/dL
pH, UA: 6.5 (ref 5.0–8.0)

## 2024-05-26 NOTE — Assessment & Plan Note (Addendum)
 Started during covid (2 episodes of sharp fleeing pain) Now only hurts with deep breath  No resp/GI or urinary symptoms  Normal exam today  Urinalysis today notes leukocytes- sent for culture  Lab pending  Cxr ordered : normal with no acute changes

## 2024-05-26 NOTE — Patient Instructions (Signed)
 Go to the [facility] location for a chest xray at your convenience  Lab today Urinalysis today   Stay hydrated  Take care of yourself

## 2024-05-26 NOTE — Assessment & Plan Note (Addendum)
 Suspect this is still cause of fatigue  Reviewed notes from Dr Jimmy  Lab today Cxr clear  Urinalysis with leuk- sent for culture /treat if positive /encouraged good hydration

## 2024-05-26 NOTE — Assessment & Plan Note (Addendum)
 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

## 2024-05-26 NOTE — Progress Notes (Signed)
 Subjective:    Patient ID: Sierra Salinas, female    DOB: Mar 16, 1946, 78 y.o.   MRN: [id number]  HPI  Wt Readings from Last 3 Encounters:  05/26/24 180 lb 4 oz (81.8 kg)  05/13/24 181 lb (82.1 kg)  11/12/23 180 lb (81.6 kg)   28.66 kg/m  Vitals:   05/26/24 1455  BP: (!) 142/86  Pulse: 86  Temp: 98.4 F (36.9 C)  SpO2: 99%    78 yo pt of Dr Jimmy with c/o left side pain and fatigue   Left side/abd pain  Worse with a deep breath  On and off for a month   Started when she had covid -intense left side flank pain lasting 5 min when lying down  It went away and it occurred again   After that still felt something when she took a deep breath   She was worried about her pancreas  No n/v or middle abd pain   No cough  Not shortness of breath but feels the need to take a deep breath occasionally when lying down  No wheezing   Exhausted Cannot make herself do anything  Dozes off easily   Temp 98.4 Per pt her baseline is usually a point lower   Bad muscle pain in shoulders /neck  Has to take tylenol  around the clock   No chills or sweats   No history of blood clots Wears support stockings     History of ccy and abd hysterectomy in the past  Also CKD   Recently seen for post covid syndrome on 7/2     (had covid in mid June -home tests positive)  Fatigue/weakness and reduced appetite  Today says she is still very fatigued     Does not feel depressed Is anxious re: health   Urinalysis today Results for orders placed or performed in visit on 05/26/24  POCT Urinalysis Dipstick (Automated)   Collection Time: 05/26/24  4:29 PM  Result Value Ref Range   Color, UA Yellow    Clarity, UA Clear    Glucose, UA Negative Negative   Bilirubin, UA Negative    Ketones, UA Negative    Spec Grav, UA 1.010 1.010 - 1.025   Blood, UA Negative    pH, UA 6.5 5.0 - 8.0   Protein, UA Negative Negative   Urobilinogen, UA 0.2 0.2 or 1.0 E.U./dL   Nitrite, UA Negative     Leukocytes, UA Large (3+) (A) Negative     Cxr DG Chest 2 View Result Date: 05/26/2024 CLINICAL DATA:  Left flank pain EXAM: CHEST - 2 VIEW COMPARISON:  None Available. FINDINGS: The heart size and mediastinal contours are within normal limits. Both lungs are clear. The visualized skeletal structures are unremarkable. IMPRESSION: No active cardiopulmonary disease. Electronically Signed   By: Greig Pique M.D.   On: 05/26/2024 16:31      Patient Active Problem List   Diagnosis Date Noted   Left flank pain 05/26/2024   Pain of shoulder girdle 05/26/2024   Fatigue 05/26/2024   Post-COVID syndrome 05/13/2024   Acute cholecystitis 11/12/2023   Gout 05/29/2023   Restless legs 01/31/2023   Trapezius strain, right, initial encounter 09/11/2022   Stage 3a chronic kidney disease (HCC) 08/15/2021   Sleep disturbance 08/07/2019   Menopausal syndrome 02/18/2015   Advance directive discussed with patient 02/18/2015   Routine general medical examination at a health care facility 10/15/2011   Osteoarthritis of multiple joints    Hyperlipemia 08/17/2009  Chronic venous insufficiency 07/23/2007   Recurrent cystitis 07/23/2007   Past Medical History:  Diagnosis Date   Allergy    Cystitis    recurrent   Hyperlipidemia    Osteoarthritis of multiple joints    Osteopenia    Squamous cell carcinoma 2000   right leg   Unspecified venous (peripheral) insufficiency    Past Surgical History:  Procedure Laterality Date   ABDOMINAL HYSTERECTOMY     CHOLECYSTECTOMY     COLONOSCOPY     NASAL SEPTUM SURGERY     Social History   Tobacco Use   Smoking status: Former    Passive exposure: Past   Smokeless tobacco: Never  Substance Use Topics   Alcohol use: Yes    Alcohol/week: 0.0 standard drinks of alcohol    Comment: occassional   Drug use: No   Family History  Problem Relation Age of Onset   Arthritis Mother    Hypertension Mother    Ovarian cancer Maternal Aunt 27   Stroke  Maternal Grandmother    Colon cancer Maternal Aunt 67   Breast cancer Neg Hx    Allergies  Allergen Reactions   Nitrofurantoin     Pt denies   Current Outpatient Medications on File Prior to Visit  Medication Sig Dispense Refill   acetaminophen  (TYLENOL ) 325 MG tablet Take 650 mg by mouth every 6 (six) hours as needed.     Calcium Carbonate-Vitamin D  (CALCIUM-D PO) Take by mouth.     CRANBERRY PO Take by mouth.     Cyanocobalamin  (B-12 PO) Take by mouth.     estradiol  (ESTRACE ) 0.5 MG tablet TAKE 1 TABLET (0.5 MG TOTAL) BY MOUTH 2 (TWO) TIMES A WEEK. 24 tablet 4   Multiple Vitamin (MULTIVITAMIN) capsule Take 1 capsule by mouth daily.     No current facility-administered medications on file prior to visit.    Review of Systems  Constitutional:  Positive for appetite change and fatigue. Negative for chills, diaphoresis and fever.  HENT:  Negative for congestion and sore throat.   Respiratory:  Negative for cough, chest tightness and wheezing.   Cardiovascular:  Negative for chest pain, palpitations and leg swelling.  Gastrointestinal:  Negative for blood in stool, diarrhea, nausea and vomiting.  Genitourinary:  Negative for dysuria, hematuria and urgency.  Musculoskeletal:  Positive for myalgias.       Shoulder and neck muscle tightness- goes for massage treatment   Skin:  Negative for rash.  Neurological:  Negative for dizziness and headaches.       Objective:   Physical Exam Constitutional:      General: She is not in acute distress.    Appearance: Normal appearance. She is well-developed.     Comments: Overweight   HENT:     Head: Normocephalic and atraumatic.     Right Ear: Tympanic membrane and ear canal normal.     Left Ear: Tympanic membrane and ear canal normal.     Nose: Congestion present.     Mouth/Throat:     Mouth: Mucous membranes are moist.     Pharynx: Oropharynx is clear. No oropharyngeal exudate or posterior oropharyngeal erythema.  Eyes:     General:  No scleral icterus.       Right eye: No discharge.        Left eye: No discharge.     Conjunctiva/sclera: Conjunctivae normal.     Pupils: Pupils are equal, round, and reactive to light.  Neck:     Thyroid :  No thyromegaly.     Vascular: No carotid bruit or JVD.  Cardiovascular:     Rate and Rhythm: Normal rate and regular rhythm.     Heart sounds: Normal heart sounds.     No gallop.  Pulmonary:     Effort: Pulmonary effort is normal. No respiratory distress.     Breath sounds: Normal breath sounds. No stridor. No wheezing, rhonchi or rales.     Comments: No rib tenderness  Chest:     Chest wall: No tenderness.  Abdominal:     General: Abdomen is protuberant. Bowel sounds are normal. There is no distension or abdominal bruit.     Palpations: Abdomen is soft. There is no shifting dullness, fluid wave, hepatomegaly, splenomegaly, mass or pulsatile mass.     Tenderness: There is no abdominal tenderness. There is no right CVA tenderness, left CVA tenderness, guarding or rebound. Negative signs include Murphy's sign.     Hernia: No hernia is present.  Musculoskeletal:     Cervical back: Normal range of motion and neck supple.     Right lower leg: No edema.     Left lower leg: No edema.  Lymphadenopathy:     Cervical: No cervical adenopathy.  Skin:    General: Skin is warm and dry.     Coloration: Skin is not pale.     Findings: No rash.  Neurological:     Mental Status: She is alert.     Cranial Nerves: No cranial nerve deficit.     Motor: No weakness.     Coordination: Coordination normal.     Gait: Gait normal.     Deep Tendon Reflexes: Reflexes are normal and symmetric. Reflexes normal.  Psychiatric:        Mood and Affect: Mood is anxious.        Cognition and Memory: Cognition and memory normal.     Comments: Candidly discusses symptoms and stressors             Assessment & Plan:   Problem List Items Addressed This Visit       Other   Post-COVID syndrome -  Primary   Suspect this is still cause of fatigue  Reviewed notes from Dr Jimmy  Lab today Cxr  Urinalysis       Relevant Orders   DG Chest 2 View (Completed)   Pain of shoulder girdle   Crp and esr and cbc today  ? PMR  Myofascial pain syndrome also in differential in setting of ccy in jan and covid last mo      Relevant Orders   Sedimentation Rate   C-reactive protein   CBC with Differential/Platelet   Left flank pain   Started during covid (2 episodes of sharp fleeing pain) Now only hurts with deep breath  No resp/GI or urinary symptoms   Urinalysis  Lab  Cxr ordered       Relevant Orders   POCT Urinalysis Dipstick (Automated) (Completed)   DG Chest 2 View (Completed)   Urine Culture   Fatigue   Suspect post covid syndrome Lab today  Reassuring exam       Relevant Orders   CBC with Differential/Platelet   Vitamin B12   TSH   Comprehensive metabolic panel with GFR   Other Visit Diagnoses       Abnormal urinalysis       Relevant Orders   Urine Culture

## 2024-05-26 NOTE — Assessment & Plan Note (Addendum)
 Crp and esr and cbc today to r/o PMR No joint swelling or rashes   Myofascial pain syndrome also in differential in setting of ccy in jan and covid last mo And also high stress level

## 2024-05-27 LAB — COMPREHENSIVE METABOLIC PANEL WITH GFR
ALT: 43 U/L — ABNORMAL HIGH (ref 0–35)
AST: 35 U/L (ref 0–37)
Albumin: 3.8 g/dL (ref 3.5–5.2)
Alkaline Phosphatase: 129 U/L — ABNORMAL HIGH (ref 39–117)
BUN: 20 mg/dL (ref 6–23)
CO2: 28 meq/L (ref 19–32)
Calcium: 9.5 mg/dL (ref 8.4–10.5)
Chloride: 96 meq/L (ref 96–112)
Creatinine, Ser: 0.84 mg/dL (ref 0.40–1.20)
GFR: 66.63 mL/min (ref >60.00)
Glucose, Bld: 96 mg/dL (ref 70–99)
Potassium: 4.5 meq/L (ref 3.5–5.1)
Sodium: 133 meq/L — ABNORMAL LOW (ref 135–145)
Total Bilirubin: 0.5 mg/dL (ref 0.2–1.2)
Total Protein: 6.9 g/dL (ref 6.0–8.3)

## 2024-05-27 LAB — CBC WITH DIFFERENTIAL/PLATELET
Basophils Absolute: 0.1 K/uL (ref 0.0–0.1)
Basophils Relative: 1 % (ref 0.0–3.0)
Eosinophils Absolute: 0.1 K/uL (ref 0.0–0.7)
Eosinophils Relative: 1.9 % (ref 0.0–5.0)
HCT: 41.3 % (ref 36.0–46.0)
Hemoglobin: 13.9 g/dL (ref 12.0–15.0)
Lymphocytes Relative: 25.4 % (ref 12.0–46.0)
Lymphs Abs: 1.8 K/uL (ref 0.7–4.0)
MCHC: 33.6 g/dL (ref 30.0–36.0)
MCV: 92 fl (ref 78.0–100.0)
Monocytes Absolute: 0.8 K/uL (ref 0.1–1.0)
Monocytes Relative: 11.7 % (ref 3.0–12.0)
Neutro Abs: 4.2 K/uL (ref 1.4–7.7)
Neutrophils Relative %: 60 % (ref 43.0–77.0)
Platelets: 396 K/uL (ref 150.0–400.0)
RBC: 4.49 Mil/uL (ref 3.87–5.11)
RDW: 13.6 % (ref 11.5–15.5)
WBC: 7 K/uL (ref 4.0–10.5)

## 2024-05-27 LAB — VITAMIN B12: Vitamin B-12: >1500 pg/mL — ABNORMAL HIGH (ref 211–911)

## 2024-05-27 LAB — URINE CULTURE
MICRO NUMBER:: 16700927
SPECIMEN QUALITY:: ADEQUATE

## 2024-05-27 LAB — TSH: TSH: 3.05 u[IU]/mL (ref 0.35–5.50)

## 2024-05-27 LAB — C-REACTIVE PROTEIN: CRP: 2.3 mg/dL (ref 0.5–20.0)

## 2024-05-27 LAB — SEDIMENTATION RATE: Sed Rate: 14 mm/h (ref 0–30)

## 2024-06-15 ENCOUNTER — Ambulatory Visit (INDEPENDENT_AMBULATORY_CARE_PROVIDER_SITE_OTHER): Admitting: Internal Medicine

## 2024-06-15 ENCOUNTER — Encounter: Payer: Self-pay | Admitting: Internal Medicine

## 2024-06-15 ENCOUNTER — Ambulatory Visit: Payer: Self-pay | Admitting: *Deleted

## 2024-06-15 VITALS — BP 132/86 | HR 82 | Temp 98.4°F | Ht 66.75 in | Wt 179.8 lb

## 2024-06-15 DIAGNOSIS — U099 Post covid-19 condition, unspecified: Secondary | ICD-10-CM

## 2024-06-15 MED ORDER — NORTRIPTYLINE HCL 10 MG PO CAPS: 10.0000 mg | ORAL_CAPSULE | Freq: Every day | ORAL | 2 refills | Status: DC

## 2024-06-15 NOTE — Progress Notes (Signed)
 Subjective:    Patient ID: Sierra Salinas, female    DOB: [birth year], 78 y.o.   MRN: [id number]  HPI Here due to ongoing shoulder and other pains  Having shoulder, neck and arm pain Totally limiting her---excruciating pain Hard to even get out of the bed Can't lift arms without severe pain  Trouble sleeping---goes 2 hours, then up to bathroom Frequent awakening to void and very painful  Has to get up and take 2 tylenol  and wait an hour before she can even move Taking 3000mg  tylenol  daily By afternoon--she is able to do a little more (housework, shopping) Then worsens by night---and tylenol  doesn't help any more  Trapezius very tight Deep tissue massage, stretching--only helps very briefly  Also having trouble with her left wrist Thought it might be gout--took colchicine  (slightly better and then got diarrhea)  Tramadol  didn't really help  Current Outpatient Medications on File Prior to Visit  Medication Sig Dispense Refill   acetaminophen  (TYLENOL ) 325 MG tablet Take 650 mg by mouth every 6 (six) hours as needed.     Calcium Carbonate-Vitamin D  (CALCIUM-D PO) Take by mouth.     CRANBERRY PO Take by mouth.     estradiol  (ESTRACE ) 0.5 MG tablet TAKE 1 TABLET (0.5 MG TOTAL) BY MOUTH 2 (TWO) TIMES A WEEK. 24 tablet 4   Multiple Vitamin (MULTIVITAMIN) capsule Take 1 capsule by mouth daily.     Cyanocobalamin  (B-12 PO) Take by mouth.     No current facility-administered medications on file prior to visit.    Allergies  Allergen Reactions   Nitrofurantoin     Pt denies    Past Medical History:  Diagnosis Date   Allergy    Cystitis    recurrent   Hyperlipidemia    Osteoarthritis of multiple joints    Osteopenia    Squamous cell carcinoma 2000   right leg   Unspecified venous (peripheral) insufficiency     Past Surgical History:  Procedure Laterality Date   ABDOMINAL HYSTERECTOMY     CHOLECYSTECTOMY     COLONOSCOPY     NASAL SEPTUM SURGERY      Family  History  Problem Relation Age of Onset   Arthritis Mother    Hypertension Mother    Ovarian cancer Maternal Aunt 65   Stroke Maternal Grandmother    Colon cancer Maternal Aunt 58   Breast cancer Neg Hx     Social History   Socioeconomic History   Marital status: Divorced    Spouse name: Not on file   Number of children: 2   Years of education: Not on file   Highest education level: Not on file  Occupational History   Occupation: Manufacturing engineer    Comment: Retired  Tobacco Use   Smoking status: Former    Passive exposure: Past   Smokeless tobacco: Never  Substance and Sexual Activity   Alcohol use: Yes    Alcohol/week: 0.0 standard drinks of alcohol    Comment: occassional   Drug use: No   Sexual activity: Not on file  Other Topics Concern   Not on file  Social History Narrative   Has living will   Has designated son [name] to be health care POA   Would accept resuscitation attempts---no prolonged artificial ventilation   Would accept feeding tube for limited time depending on circumstances   Social Drivers of Health   Financial Resource Strain: Not on file  Food Insecurity: No Food Insecurity (11/13/2023)  Hunger Vital Sign    Worried About Running Out of Food in the Last Year: Never true    Ran Out of Food in the Last Year: Never true  Transportation Needs: No Transportation Needs (11/13/2023)   PRAPARE - Administrator, Civil Service (Medical): No    Lack of Transportation (Non-Medical): No  Physical Activity: Not on file  Stress: Not on file  Social Connections: Moderately Integrated (11/13/2023)   Social Connection and Isolation Panel    Frequency of Communication with Friends and Family: Never    Frequency of Social Gatherings with Friends and Family: More than three times a week    Attends Religious Services: More than 4 times per year    Active Member of [employer] or Organizations: Yes    Attends Banker Meetings: More than 4  times per year    Marital Status: Widowed  Intimate Partner Violence: Not At Risk (11/13/2023)   Humiliation, Afraid, Rape, and Kick questionnaire    Fear of Current or Ex-Partner: No    Emotionally Abused: No    Physically Abused: No    Sexually Abused: No   Review of Systems No fever No rash Eating better now Weight is down about 10# since COVID    Objective:   Physical Exam Constitutional:      Appearance: Normal appearance.  Neck:     Comments: Slight tightness in traps but good ROM in neck Cardiovascular:     Rate and Rhythm: Normal rate and regular rhythm.     Heart sounds: No murmur heard.    No gallop.  Pulmonary:     Effort: Pulmonary effort is normal.     Breath sounds: Normal breath sounds. No wheezing or rales.  Musculoskeletal:     Comments: Mild decrease in ROM in shoulders Left wrist without swelling---just some pain with full extension  Lymphadenopathy:     Cervical: No cervical adenopathy.  Neurological:     Mental Status: She is alert.  Psychiatric:        Mood and Affect: Mood normal.        Behavior: Behavior normal.            Assessment & Plan:

## 2024-06-15 NOTE — Telephone Encounter (Signed)
 FYI Only or Action Required?: Action required by provider: request for appointment.  Patient was last seen in primary care on 05/26/2024 by Randeen Laine LABOR, MD.  Called Nurse Triage reporting Pain.  Symptoms began about a month ago.  Interventions attempted: OTC medications: tylenol  3000 mg every day x 1 month.  Symptoms are: gradually worsening.  Triage Disposition: See HCP Within 4 Hours (Or PCP Triage)  Patient/caregiver understands and will follow disposition?: Yes              Copied from CRM #[id number]. Topic: Clinical - Red Word Triage >> Jun 15, 2024  8:11 AM Sierra Salinas wrote: Sierra Salinas that prompted transfer to Nurse Triage: Patient calling states she has shoulder and neck pain, with difficulty lifting both arms. Reason for Disposition  [1] Shoulder pains with exertion (e.g., walking) AND [2] pain goes away on resting AND [3] not present now  Answer Assessment - Initial Assessment Questions Appt today . Difficulty lifting bilateral arms due to shoulder pain severe     1. ONSET: When did the pain start?     Since end of June 2. LOCATION: Where is the pain located?     Bilateral shoulders in joint or muscles and neck in back 3. PAIN: How bad is the pain? (Scale 1-10; or mild, moderate, severe)     severe 4. WORK OR EXERCISE: Has there been any recent work or exercise that involved this part of the body?     Na  5. CAUSE: What do you think is causing the shoulder pain?     Not sure 6. OTHER SYMPTOMS: Do you have any other symptoms? (e.g., neck pain, swelling, rash, fever, numbness, weakness)     Left wrist pain , bilateral shoulder pain, back neck pain  7. PREGNANCY: Is there any chance you are pregnant? When was your last menstrual period?     na  Protocols used: Shoulder Pain-A-AH

## 2024-06-15 NOTE — Assessment & Plan Note (Signed)
 Now worse Fibromyalgia like syndrome now Sed rate normal--and not in hips---so doesn't seem to be PMR Will refer to Infectious disease for second opinion Discussed medication options---will try nortriptyline  10mg  at bedtime--and titrate

## 2024-06-15 NOTE — Telephone Encounter (Signed)
 Okay---will assess at today's visit

## 2024-06-16 ENCOUNTER — Telehealth: Payer: Self-pay | Admitting: Internal Medicine

## 2024-06-16 NOTE — Telephone Encounter (Signed)
 Spoke to pt. She said she does not think the nortriptyline  is going to help her. She will try it the rest of the week and give us  an update on Friday (8-8)

## 2024-06-16 NOTE — Telephone Encounter (Signed)
 Copied from CRM [id number]. Topic: Referral - Status >> Jun 16, 2024 11:19 AM Suzen RAMAN wrote: Reason for CRM: Referral # [id number]  Per referred office(Margaret from [facility] Infectious Disease Referral dept) they don't handled post COVID symptoms.

## 2024-06-19 ENCOUNTER — Ambulatory Visit: Payer: Self-pay

## 2024-06-19 ENCOUNTER — Other Ambulatory Visit: Payer: Self-pay | Admitting: Internal Medicine

## 2024-06-19 MED ORDER — PREDNISONE 20 MG PO TABS: 40.0000 mg | ORAL_TABLET | Freq: Every day | ORAL | 0 refills | Status: DC

## 2024-06-19 NOTE — Telephone Encounter (Signed)
 Spoke to pt. She was appreciative for the chance to try it. She will schedule an OV if she is a lot better on the prednisone .

## 2024-06-19 NOTE — Telephone Encounter (Signed)
 FYI Only or Action Required?: Action required by provider: update on patient condition.  Patient was last seen in primary care on 06/15/2024 by Sierra Charlie FERNS, MD.  Called Nurse Triage reporting Medication Problem.  Symptoms began at age 78.  Interventions attempted: Nothing.  Symptoms are: unchanged.  Triage Disposition: Call PCP Now  Patient/caregiver understands and will follow disposition?: Yes  Copied from CRM #[id number]. Topic: Clinical - Medication Question >> Jun 19, 2024  8:32 AM Montie POUR wrote: Reason for CRM:  [name] would like to speak with a nurse about nortriptyline  (PAMELOR ) 10 MG capsule. The medication is not working and she would like to discuss what else to take. Please call her at [phone]. Thanks Reason for Disposition  [1] Caller has URGENT medicine question about med that primary care doctor (or NP/PA) or specialist prescribed AND [2] triager unable to answer question  Answer Assessment - Initial Assessment Questions 1. NAME of MEDICINE: What medicine(s) are you calling about?     Nortriptyline   2. QUESTION: What is your question? (e.g., double dose of medicine, side effect)     Medication is not working, would like to try prednisone  vs increasing dose of nortriptyline  for now. If the prednisone  doesn't work then patient is willing to increase nortriptyline   3. PRESCRIBER: Who prescribed the medicine? Reason: if prescribed by specialist, call should be referred to that group.     [facility]  4. SYMPTOMS: Do you have any symptoms? If Yes, ask: What symptoms are you having?  How bad are the symptoms (e.g., mild, moderate, severe)     No change in symptoms  5. PREGNANCY:  Is there any chance that you are pregnant? When was your last menstrual period?     No  Protocols used: Medication Question Call-A-AH

## 2024-06-19 NOTE — Telephone Encounter (Signed)
 Spoke to pt. She said she has not increased it. Will try 2, then maybe 3. Said she does not want to go up any higher than 3 of the nortriptyline . Pt is asking why can she not have prednisone  to try.

## 2024-06-19 NOTE — Telephone Encounter (Signed)
 Spoke to pt. She is asking if she could have a round of Prednisone  to try and help the neck, shoulders, and arm pain she is having. More muscular. Seems to have started about 2 weeks after having Covid in June. She is in PT. Taking 3000 mg of Tylenol  daily that helps at times. Does not help at night when laying down.

## 2024-06-22 ENCOUNTER — Encounter: Payer: Self-pay | Admitting: Internal Medicine

## 2024-06-22 ENCOUNTER — Ambulatory Visit (INDEPENDENT_AMBULATORY_CARE_PROVIDER_SITE_OTHER): Admitting: Internal Medicine

## 2024-06-22 VITALS — BP 130/80 | HR 107 | Temp 98.6°F | Ht 66.75 in | Wt 180.0 lb

## 2024-06-22 DIAGNOSIS — M353 Polymyalgia rheumatica: Secondary | ICD-10-CM | POA: Insufficient documentation

## 2024-06-22 MED ORDER — PREDNISONE 5 MG PO TABS: 20.0000 mg | ORAL_TABLET | Freq: Every day | ORAL | 3 refills | Status: AC

## 2024-06-22 NOTE — Assessment & Plan Note (Signed)
 Shoulders only--not hips Normal sed rate The symptoms on the shoulders are classic and her striking response to prednisone  is fairly diagnostic Will change prednisone  to 20mg  daily Maintain for about 2 months to assure remission--then can start weaning by 2.5mg  a day (about every 2-4 weeks)

## 2024-06-22 NOTE — Progress Notes (Signed)
 Subjective:    Patient ID: Sierra Salinas, female    DOB: September 11, 1946, 78 y.o.   MRN: [id number]  HPI Here to review apparent polymyalgia rheumatica syndrome  Took the initial dose of prednisone --and 3 hours later she had no pain Like it had never happened  Current Outpatient Medications on File Prior to Visit  Medication Sig Dispense Refill   acetaminophen  (TYLENOL ) 325 MG tablet Take 650 mg by mouth every 6 (six) hours as needed.     Calcium Carbonate-Vitamin D  (CALCIUM-D PO) Take by mouth.     CRANBERRY PO Take by mouth.     estradiol  (ESTRACE ) 0.5 MG tablet TAKE 1 TABLET (0.5 MG TOTAL) BY MOUTH 2 (TWO) TIMES A WEEK. 24 tablet 4   Multiple Vitamin (MULTIVITAMIN) capsule Take 1 capsule by mouth daily.     predniSONE  (DELTASONE ) 20 MG tablet Take 2 tablets (40 mg total) by mouth daily. For 3 days, then 1 tab daily for 3 days 9 tablet 0   No current facility-administered medications on file prior to visit.    Allergies  Allergen Reactions   Nitrofurantoin     Pt denies    Past Medical History:  Diagnosis Date   Allergy    Cystitis    recurrent   Hyperlipidemia    Osteoarthritis of multiple joints    Osteopenia    Squamous cell carcinoma 2000   right leg   Unspecified venous (peripheral) insufficiency     Past Surgical History:  Procedure Laterality Date   ABDOMINAL HYSTERECTOMY     CHOLECYSTECTOMY     COLONOSCOPY     NASAL SEPTUM SURGERY      Family History  Problem Relation Age of Onset   Arthritis Mother    Hypertension Mother    Ovarian cancer Maternal Aunt 35   Stroke Maternal Grandmother    Colon cancer Maternal Aunt 67   Breast cancer Neg Hx     Social History   Socioeconomic History   Marital status: Divorced    Spouse name: Not on file   Number of children: 2   Years of education: Not on file   Highest education level: Not on file  Occupational History   Occupation: Manufacturing engineer    Comment: Retired  Tobacco Use   Smoking status:  Former    Passive exposure: Past   Smokeless tobacco: Never  Substance and Sexual Activity   Alcohol use: Yes    Alcohol/week: 0.0 standard drinks of alcohol    Comment: occassional   Drug use: No   Sexual activity: Not on file  Other Topics Concern   Not on file  Social History Narrative   Has living will   Has designated son [name] to be health care POA   Would accept resuscitation attempts---no prolonged artificial ventilation   Would accept feeding tube for limited time depending on circumstances   Social Drivers of Health   Financial Resource Strain: Not on file  Food Insecurity: No Food Insecurity (11/13/2023)   Hunger Vital Sign    Worried About Running Out of Food in the Last Year: Never true    Ran Out of Food in the Last Year: Never true  Transportation Needs: No Transportation Needs (11/13/2023)   PRAPARE - Administrator, Civil Service (Medical): No    Lack of Transportation (Non-Medical): No  Physical Activity: Not on file  Stress: Not on file  Social Connections: Moderately Integrated (11/13/2023)   Social Connection and Isolation  Panel    Frequency of Communication with Friends and Family: Never    Frequency of Social Gatherings with Friends and Family: More than three times a week    Attends Religious Services: More than 4 times per year    Active Member of [employer] or Organizations: Yes    Attends Banker Meetings: More than 4 times per year    Marital Status: Widowed  Intimate Partner Violence: Not At Risk (11/13/2023)   Humiliation, Afraid, Rape, and Kick questionnaire    Fear of Current or Ex-Partner: No    Emotionally Abused: No    Physically Abused: No    Sexually Abused: No   Review of Systems Slight effect on sleep--used OTC for a couple of nights Appetite pretty much back to normal     Objective:   Physical Exam Constitutional:      Appearance: Normal appearance.  Neurological:     Mental Status: She is alert.             Assessment & Plan:

## 2024-06-22 NOTE — Patient Instructions (Signed)
 Please take the prednisone  20mg  daily for at least 6 weeks (or can be 8 weeks if your symptoms take longer to go away). At that point you can decrease the dose by 2.5mg  a day (like taking 20mg  one day and 15mg  the next). After a month or so, you can reduce another 2.5 a day (like 20 alternating with 10)

## 2024-06-22 NOTE — Telephone Encounter (Signed)
 Copied from CRM #[id number]. Topic: Clinical - Medical Advice >> Jun 22, 2024  8:41 AM Martinique E wrote: Reason for CRM: Patient was started on Prednisone last week and she wanted to relay to PCP and nurse that it is helping with pain. Patient would like to know next steps, if PCP wants to keep her on this medication or if she needs a referral for rheumatology. Callback number [phone].

## 2024-06-22 NOTE — Telephone Encounter (Signed)
 Called and spoke to pt. She is busy through the week. Made her an appt for today at 345.

## 2024-06-26 DIAGNOSIS — H25813 Combined forms of age-related cataract, bilateral: Secondary | ICD-10-CM | POA: Diagnosis not present

## 2024-06-26 DIAGNOSIS — H18513 Endothelial corneal dystrophy, bilateral: Secondary | ICD-10-CM | POA: Diagnosis not present

## 2024-07-27 DIAGNOSIS — Z86018 Personal history of other benign neoplasm: Secondary | ICD-10-CM | POA: Diagnosis not present

## 2024-07-27 DIAGNOSIS — D225 Melanocytic nevi of trunk: Secondary | ICD-10-CM | POA: Diagnosis not present

## 2024-07-27 DIAGNOSIS — L814 Other melanin hyperpigmentation: Secondary | ICD-10-CM | POA: Diagnosis not present

## 2024-07-27 DIAGNOSIS — Z85828 Personal history of other malignant neoplasm of skin: Secondary | ICD-10-CM | POA: Diagnosis not present

## 2024-07-27 DIAGNOSIS — L821 Other seborrheic keratosis: Secondary | ICD-10-CM | POA: Diagnosis not present

## 2024-07-27 DIAGNOSIS — L820 Inflamed seborrheic keratosis: Secondary | ICD-10-CM | POA: Diagnosis not present

## 2024-07-27 DIAGNOSIS — L578 Other skin changes due to chronic exposure to nonionizing radiation: Secondary | ICD-10-CM | POA: Diagnosis not present

## 2024-09-04 ENCOUNTER — Ambulatory Visit: Payer: Self-pay | Admitting: Urology

## 2024-09-07 ENCOUNTER — Ambulatory Visit: Admitting: Urology

## 2024-09-17 ENCOUNTER — Ambulatory Visit

## 2024-09-17 VITALS — BP 124/74 | HR 99 | Temp 98.2°F | Ht 66.75 in | Wt 184.0 lb

## 2024-09-17 DIAGNOSIS — N951 Menopausal and female climacteric states: Secondary | ICD-10-CM | POA: Diagnosis not present

## 2024-09-17 DIAGNOSIS — M353 Polymyalgia rheumatica: Secondary | ICD-10-CM

## 2024-09-17 DIAGNOSIS — Z131 Encounter for screening for diabetes mellitus: Secondary | ICD-10-CM | POA: Diagnosis not present

## 2024-09-17 DIAGNOSIS — Z136 Encounter for screening for cardiovascular disorders: Secondary | ICD-10-CM

## 2024-09-17 DIAGNOSIS — Z23 Encounter for immunization: Secondary | ICD-10-CM

## 2024-09-17 NOTE — Patient Instructions (Addendum)
 Thank you for visiting [facility] today! Here's what we talked about:  - Stay on 5mg  daily for 1 more week (total 2 wks) then 2.5mg  daily for 2wks then STOP - Watch for agitation, palpitations, weakness, dizziness, if these occur, call clinic - Call and schedule with eye doctor to check eye pressures

## 2024-09-17 NOTE — Progress Notes (Signed)
 Subjective:   This visit was conducted in person. The patient gave informed consent to the use of Abridge AI technology to record the contents of the encounter as documented below.   Patient ID: Sierra Salinas, female    DOB: Jun 24, 1946, 78 y.o.   MRN: [id number]   Discussed the use of AI scribe software for clinical note transcription with the patient, who gave verbal consent to proceed.  History of Present Illness Sierra Salinas is a 78 year old female with polymyalgia rheumatica who presents for a follow-up on prednisone  taper and management of menopausal symptoms. She was accompanied by her granddaughter, Arleene.  She reports a history of polymyalgia rheumatica that began after a COVID-19 infection in June 2025. The pain was described as the most severe she has ever experienced. Her symptoms responded well to prednisone , with significant pain relief after the first dose. She is currently on a prednisone  taper, having reduced from 7.5 mg to 5 mg daily over the past week.  She has menopausal symptoms, including hot flashes and sleep disturbances. She has been on hormone replacement therapy for nearly 20 years, initially with Premarin  and currently with estradiol , taking two doses per week. She experiences occasional hot flashes and sleep issues, such as difficulty falling and staying asleep, but these are not frequent.  She has a history of high cholesterol but is not currently on medication for it. She also has a history of gout but has not experienced any recent flare-ups.  She experiences visual disturbances, described as 'jagged light streaks' in her eyes, which began after starting prednisone . These episodes last about ten minutes and occur without headache or pain. She has a history of a floater in her eye but describes the current symptoms as different.  She has a history of chronic kidney disease, with previous lab results showing a decrease in kidney function. However, a recent  evaluation by a kidney specialist showed normal results, and she was released from further follow-up.  She is currently taking oral cranberry supplements, calcium, and vitamin D . She previously used Tylenol  up to 3000 mg daily for pain management before starting prednisone , but she no longer requires it.   Review of Systems  All other systems reviewed and are negative.       Allergies  Allergen Reactions   Nitrofurantoin     Pt denies    Current Outpatient Medications on File Prior to Visit  Medication Sig Dispense Refill   acetaminophen  (TYLENOL ) 325 MG tablet Take 650 mg by mouth every 6 (six) hours as needed.     Calcium Carbonate-Vitamin D  (CALCIUM-D PO) Take by mouth.     CRANBERRY PO Take by mouth.     estradiol  (ESTRACE ) 0.5 MG tablet TAKE 1 TABLET (0.5 MG TOTAL) BY MOUTH 2 (TWO) TIMES A WEEK. 24 tablet 4   Multiple Vitamin (MULTIVITAMIN) capsule Take 1 capsule by mouth daily.     predniSONE  (DELTASONE ) 5 MG tablet Take 4 tablets (20 mg total) by mouth daily with breakfast. Wean as directed (Patient taking differently: Take 5 mg by mouth daily with breakfast. Wean as directed) 120 tablet 3   No current facility-administered medications on file prior to visit.    BP 124/74 (BP Location: Left Arm, Patient Position: Sitting, Cuff Size: Large)   Pulse 99   Temp 98.2 F (36.8 C) (Oral)   Ht 5' 6.75 (1.695 m)   Wt 184 lb (83.5 kg)   SpO2 97%   BMI 29.03 kg/m  Objective:      Physical Exam GENERAL: Alert, cooperative, well developed, no acute distress. HEAD: Normocephalic atraumatic. EYES: Extraocular movements intact BL, pupils round, equal and reactive to light BL, conjunctivae normal BL. EXTREMITIES: No cyanosis or edema. NEUROLOGICAL: Oriented to person, place and time, no gait abnormalities, moves all extremities without gross motor or sensory deficit.         Assessment & Plan:   Assessment & Plan Polymyalgia rheumatica Currently tapering prednisone .   Symptoms otherwise well-controlled at this time. Complaining of intermittent floaters/flashes, unclear etiology at this time, visual acuity reportedly normal, concern given the risk of increased intraocular pressure, glaucoma as a side effect of steroid use.  Recommended patient to schedule an appointment with her ophthalmologist to rule this out.  - Continue prednisone  taper: 5 mg daily for one week, then 2.5 mg daily for two weeks, then stop. - Monitor for withdrawal symptoms: agitation, palpitations, headaches. - Contact eye doctor for evaluation of visual symptoms related to steroid use.  Menopausal syndrome Intermittent hot flashes and sleep disturbances. Discussed long-term hormone therapy risks, including breast cancer. Considered non-hormonal alternatives such as clonidine. She prefers current regimen for now, will rediscuss at a later time.  - Continue estradiol  0.5 mg twice weekly.   General Health maintenance No prior A1c test. Discussed steroid impact on blood sugar.  Would interpret A1c in light of recent long-term steroid taper and would likely repeat in 3 months if significantly elevated.  - Ordered A1c test. - Interpret results cautiously due to steroid use. -Lipid panel ordered - Received flu shot. Discussed RSV vaccine for patients over 75 during winter, declined.    Return in about 3 weeks (around 10/08/2024) for CPE, fasting labs 1 wk prior, medicare wellness 1 wk after.   Kendyn Zaman K Coline Calkin, MD  09/17/24     Contains text generated by Abridge.

## 2024-10-21 ENCOUNTER — Other Ambulatory Visit: Payer: Self-pay

## 2024-10-21 MED ORDER — ESTRADIOL 0.5 MG PO TABS: ORAL_TABLET | ORAL | 2 refills | Status: AC

## 2024-11-02 ENCOUNTER — Ambulatory Visit: Payer: Self-pay

## 2024-11-02 NOTE — Telephone Encounter (Signed)
 FYI Only or Action Required?: FYI only for provider: appointment scheduled on 12.24.25.  Patient was last seen in primary care on 09/17/2024 by Bennett Reuben POUR, MD.  Called Nurse Triage reporting Neck Pain.  Symptoms began several months ago.  Interventions attempted: Prescription medications: Prednsione.  Symptoms are: gradually worsening.  Triage Disposition: See PCP When Office is Open (Within 3 Days)  Patient/caregiver understands and will follow disposition?: Yes  Copied from CRM #[id number]. Topic: Clinical - Red Word Triage >> Nov 02, 2024  7:57 AM Donna BRAVO wrote: Red Word that prompted transfer to Nurse Triage:  prednisone  since August  -reduced dosage  -pain has come back -back of neck, down her back, up the back of her head -pain is worse in the morning Reason for Disposition  Neck pain present > 2 weeks  Answer Assessment - Initial Assessment Questions 1. ONSET: When did the pain begin?       X Months  2. LOCATION: Where does it hurt?       Neck  3. PATTERN Does the pain come and go, or has it been constant since it started?       Comes and goes  4. SEVERITY: How bad is the pain?  (Scale 0-10; or none or slight stiffness, mild, moderate, severe)      8/10  5. RADIATION: Does the pain go anywhere else, shoot into your arms?      Back of head  6. CORD SYMPTOMS: Pt denies weakness or numbness of the arms or legs       7. CAUSE: What do you think is causing the neck pain?      Polymyalgia due to COVID  8. NECK OVERUSE: Pt denies activities that involved turning or twisting the neck        9. OTHER SYMPTOMS: Pt denies headache, fever, chest pain, difficulty breathing, neck swelling       Pt states she has a hard time sleeping through the night or lifting head.  Pt reports neck pain due to decrease in Prednisone  dose. Pt seeking advice from PCP on whether pt should increase dose. Pt is taking Prednisone  2.5 mg daily- reduced dosage Pt scheduled  for a visit on  12.24.25  for further evaluation and treatment with pt's PCP. Pt agrees with plan of care, will call back for any worsening symptoms  Protocols used: Neck Pain or Stiffness-A-AH

## 2024-11-04 ENCOUNTER — Ambulatory Visit

## 2024-11-04 VITALS — BP 102/70 | HR 95 | Temp 97.7°F | Ht 66.75 in | Wt 184.0 lb

## 2024-11-04 DIAGNOSIS — M353 Polymyalgia rheumatica: Secondary | ICD-10-CM | POA: Diagnosis not present

## 2024-11-04 DIAGNOSIS — N951 Menopausal and female climacteric states: Secondary | ICD-10-CM | POA: Diagnosis not present

## 2024-11-04 NOTE — Patient Instructions (Signed)
 Thank you for visiting [facility] today! Here's what we talked about: - START taking 4 tablets daily for the next 4 weeks

## 2024-11-04 NOTE — Progress Notes (Signed)
 "  Subjective:   This visit was conducted in person. The patient gave informed consent to the use of Abridge AI technology to record the contents of the encounter as documented below.   Patient ID: Sierra Salinas, female    DOB: [birth year], 78 y.o.   MRN: [id number]   Discussed the use of AI scribe software for clinical note transcription with the patient, who gave verbal consent to proceed.  History of Present Illness Sierra Salinas is a 78 year old female with polymyalgia rheumatica who presents with a flare-up of neck pain.  She has been experiencing neck pain for a couple of weeks, similar to previous episodes of polymyalgia rheumatica. The pain is most intense in the neck but also causes a general achy feeling throughout her body. It is severe enough to disturb her sleep, causing her to wake up around 3 to 5 AM, at which point she takes Tylenol  and waits for the pain to subside.  Initially, the neck pain was accompanied by more severe symptoms, including pain along the shoulders and down the arms, but it is now more concentrated in the neck and back. No current tingling pain or weakness in the arms. She initially started a steroid taper with prednisone  at 2.5 mg daily, but due to the recurrence of symptoms, she increased the dose to 15 mg daily for a week, which did not alleviate the symptoms. She then tapered down to 2.5 mg daily, which she continues to take. She is uncertain about the effectiveness of this regimen.  She notes a lack of energy and has been feeling more tired than usual over the past several months. No night sweats. She discontinued estradiol  several weeks ago to see if she could manage without it. Since stopping, she has experienced occasional spells of feeling overheated, lasting only a few minutes. She is unsure if these symptoms are related to stopping estradiol  or the prednisone  use.  She recalls a previous reading of 124/78. She has not yet contacted her eye doctor  despite experiencing brief episodes of 'wonky' vision, which she attributes to potential side effects of steroid use. No headaches.    Review of Systems  All other systems reviewed and are negative.       Allergies[1]  Medications Ordered Prior to Encounter[2]  BP 102/70 (BP Location: Left Arm, Patient Position: Sitting, Cuff Size: Large)   Pulse 95   Temp 97.7 F (36.5 C) (Oral)   Ht 5' 6.75 (1.695 m)   Wt 184 lb (83.5 kg)   SpO2 98%   BMI 29.03 kg/m   Objective:      Physical Exam GENERAL: Alert, cooperative, well developed, no acute distress. HEAD: Normocephalic atraumatic. EYES: Extraocular movements intact BL, pupils round, equal and reactive to light BL, conjunctivae normal BL. NEUROLOGICAL: Oriented to person, place and time, no gait abnormalities, moves all extremities without gross motor or sensory deficit. MSK: Mild TTP over shoulders, upper back and L lateral neck          Assessment & Plan:   Assessment & Plan Polymyalgia rheumatica Recurrent neck pain and generalized achiness suggest a flare. Pt self treated with 1wk of 20mg  then gradually tapered down to 2.5mg  daily current dose. Likely inadequate time at higher dose, will do 4wks at 20mg  then gradual taper. Will re-evaluate sx when I see her at CPE in a few wks.  Vision changes possibly related to steroid use, she will set up optho appt eval after christmas. - Increased  prednisone  to 20 mg daily for 4 weeks. - Re-evaluate symptoms after 4 weeks. - Monitor for steroid side effects: vision changes, blood sugar elevation, mood swings, blood pressure changes. - Encouraged follow-up with eye doctor for vision changes. - Advised to contact provider if symptoms do not improve after 2 weeks of increased dose.  Menopausal syndrome Experiencing occasional hot flashes, possibly related to prednisone  or estradiol  self discontinuation. Prefers to monitor symptoms without hormone therapy. - Continue to monitor  menopausal symptoms without hormone therapy. - Consider non-hormonal treatments like clonidine if symptoms worsen.  CPE scheduled for Jan 2026  Reuben MARLA Burkes, MD  11/04/2024     Contains text generated by Abridge.        [1]  Allergies Allergen Reactions   Nitrofurantoin     Pt denies  [2]  Current Outpatient Medications on File Prior to Visit  Medication Sig Dispense Refill   acetaminophen  (TYLENOL ) 500 MG tablet Take 1,000 mg by mouth 2 (two) times daily.     Calcium Carbonate-Vitamin D  (CALCIUM-D PO) Take by mouth.     CRANBERRY PO Take by mouth.     doxylamine, Sleep, (UNISOM) 25 MG tablet Take 12.5 mg by mouth at bedtime as needed.     estradiol  (ESTRACE ) 0.5 MG tablet TAKE 1 TABLET (0.5 MG TOTAL) BY MOUTH 2 (TWO) TIMES A WEEK. 24 tablet 2   Multiple Vitamin (MULTIVITAMIN) capsule Take 1 capsule by mouth daily.     predniSONE  (DELTASONE ) 5 MG tablet Take 4 tablets (20 mg total) by mouth daily with breakfast. Wean as directed (Patient taking differently: Take 2.5 mg by mouth daily with breakfast. Wean as directed) 120 tablet 3   No current facility-administered medications on file prior to visit.   "

## 2024-11-13 ENCOUNTER — Other Ambulatory Visit (INDEPENDENT_AMBULATORY_CARE_PROVIDER_SITE_OTHER)

## 2024-11-13 DIAGNOSIS — Z131 Encounter for screening for diabetes mellitus: Secondary | ICD-10-CM | POA: Diagnosis not present

## 2024-11-13 DIAGNOSIS — Z136 Encounter for screening for cardiovascular disorders: Secondary | ICD-10-CM

## 2024-11-13 LAB — LIPID PANEL
Cholesterol: 189 mg/dL (ref 28–200)
HDL: 49.2 mg/dL
LDL Cholesterol: 88 mg/dL (ref 10–99)
NonHDL: 139.73
Total CHOL/HDL Ratio: 4
Triglycerides: 260 mg/dL — ABNORMAL HIGH (ref 10.0–149.0)
VLDL: 52 mg/dL — ABNORMAL HIGH (ref 0.0–40.0)

## 2024-11-13 LAB — HEMOGLOBIN A1C: Hgb A1c MFr Bld: 5.7 % (ref 4.6–6.5)

## 2024-11-16 ENCOUNTER — Ambulatory Visit: Payer: Self-pay

## 2024-11-16 DIAGNOSIS — R7303 Prediabetes: Secondary | ICD-10-CM | POA: Insufficient documentation

## 2024-11-20 ENCOUNTER — Encounter

## 2024-11-23 ENCOUNTER — Telehealth: Payer: Self-pay

## 2024-11-23 NOTE — Telephone Encounter (Signed)
 Copied from CRM [id number]. Topic: Clinical - Prescription Issue >> Nov 23, 2024  9:23 AM Burnard DEL wrote: Reason for CRM: Patient called in stating that she is experiencing chills,tremors,hot flashes,muscles aches,and occasional dizzy spells for the 20 mg of prednisone  that she's prescribed. She would like to know what provider would like for her to do? She would like to know if she needs to come in for a OV?Please advised.

## 2024-11-24 ENCOUNTER — Ambulatory Visit: Payer: Self-pay

## 2024-11-24 ENCOUNTER — Encounter: Payer: Self-pay | Admitting: Family Medicine

## 2024-11-24 ENCOUNTER — Ambulatory Visit (INDEPENDENT_AMBULATORY_CARE_PROVIDER_SITE_OTHER): Admitting: Family Medicine

## 2024-11-24 VITALS — BP 149/80 | HR 90 | Temp 98.3°F | Ht 66.75 in | Wt 185.5 lb

## 2024-11-24 DIAGNOSIS — M25519 Pain in unspecified shoulder: Secondary | ICD-10-CM | POA: Diagnosis not present

## 2024-11-24 DIAGNOSIS — M2550 Pain in unspecified joint: Secondary | ICD-10-CM | POA: Insufficient documentation

## 2024-11-24 DIAGNOSIS — N951 Menopausal and female climacteric states: Secondary | ICD-10-CM

## 2024-11-24 DIAGNOSIS — M353 Polymyalgia rheumatica: Secondary | ICD-10-CM

## 2024-11-24 DIAGNOSIS — R7303 Prediabetes: Secondary | ICD-10-CM

## 2024-11-24 DIAGNOSIS — R030 Elevated blood-pressure reading, without diagnosis of hypertension: Secondary | ICD-10-CM | POA: Insufficient documentation

## 2024-11-24 DIAGNOSIS — N1831 Chronic kidney disease, stage 3a: Secondary | ICD-10-CM | POA: Diagnosis not present

## 2024-11-24 DIAGNOSIS — M25562 Pain in left knee: Secondary | ICD-10-CM | POA: Diagnosis not present

## 2024-11-24 DIAGNOSIS — R251 Tremor, unspecified: Secondary | ICD-10-CM | POA: Insufficient documentation

## 2024-11-24 DIAGNOSIS — R232 Flushing: Secondary | ICD-10-CM | POA: Diagnosis not present

## 2024-11-24 NOTE — Assessment & Plan Note (Signed)
 Shoulder girdle and neck pain  Resolves with prednisone  (was weaned slowly but symptoms returned in December)  Now prednisone  15 mg  Symptoms are better but having significant side effects (we think) including increase blood pressure /tremulous/ hot flashes /chills  Denies anxiety however  Lab today  ? Consider rheum opinion

## 2024-11-24 NOTE — Assessment & Plan Note (Addendum)
 History of left knee pain and arthritis  Some shoulder girdle pain   Fam history of RA -mother and M uncle  Ana , RF, esr and crp today  No joint deformity on exam

## 2024-11-24 NOTE — Assessment & Plan Note (Signed)
?   Prednisone  side effects Hands Bilat  With intention   Lab today

## 2024-11-24 NOTE — Progress Notes (Signed)
 "  Subjective:    Patient ID: Sierra Salinas, female    DOB: Jul 24, 1946, 79 y.o.   MRN: [id number]  HPI  Wt Readings from Last 3 Encounters:  11/24/24 185 lb 8 oz (84.1 kg)  11/04/24 184 lb (83.5 kg)  09/17/24 184 lb (83.5 kg)   29.27 kg/m  Vitals:   11/24/24 1525 11/24/24 1758  BP: (!) 150/82 (!) 149/80  Pulse: 90   Temp: 98.3 F (36.8 C)   SpO2: 99%     79 yo pt of Dr Bennett presents for c/o Prednisone  reaction   Current prednisone  side effects -started a month ago  Tremors-shaky  Chills -a bit cold natured  Muscle aches Hot flashes- really bad  Dizziness -on occasion- fleeting   No anxiety  Not irritable   Has taken temp- no fever   No symptoms of uti or uri   Off her HRT/estrogen (stopped in November)   PMR Was down to 2.5 mg this summer  Then worsened symptoms  Last visit with pcp noted recurrent neck and shoulder girdle pain  Was placed back on 4 wk of 20 mg daily for 4 weeks with gradual taper (note on 11/04/24)   Took it down to 15 mg herself -this is day 5   PMR symptoms are much better   Interestingly she took 20 mg in August and had no side effects      Lab Results  Component Value Date   ESRSEDRATE 14 05/26/2024   Lab Results  Component Value Date   CRP 2.3 05/26/2024   Lab Results  Component Value Date   HGBA1C 5.7 11/13/2024   Lab Results  Component Value Date   TSH 3.05 05/26/2024   No results found for: ANA, RF   Mother and M uncle with RA   Has history of left knee pain   No personal history of joint swelling   Lab Results  Component Value Date   NA 133 (L) 05/26/2024   K 4.5 05/26/2024   CO2 28 05/26/2024   GLUCOSE 96 05/26/2024   BUN 20 05/26/2024   CREATININE 0.84 05/26/2024   CALCIUM 9.5 05/26/2024   GFR 66.63 05/26/2024   GFRNONAA 59 (L) 11/12/2023   Lab Results  Component Value Date   ALT 43 (H) 05/26/2024   AST 35 05/26/2024   ALKPHOS 129 (H) 05/26/2024   BILITOT 0.5 05/26/2024   Lab  Results  Component Value Date   WBC 7.0 05/26/2024   HGB 13.9 05/26/2024   HCT 41.3 05/26/2024   MCV 92.0 05/26/2024   PLT 396.0 05/26/2024    Patient Active Problem List   Diagnosis Date Noted   Tremor 11/24/2024   Hot flashes 11/24/2024   Elevated BP reading w/ no diagnosis of HTN 11/24/2024   Joint pain 11/24/2024   Prediabetes 11/16/2024   Polymyalgia rheumatica syndrome 06/22/2024   Pain of shoulder girdle 05/26/2024   Post-COVID syndrome 05/13/2024   Gout 05/29/2023   Restless legs 01/31/2023   Trapezius strain, right, initial encounter 09/11/2022   Stage 3a chronic kidney disease (HCC) 08/15/2021   Sleep disturbance 08/07/2019   Menopausal syndrome 02/18/2015   Advance directive discussed with patient 02/18/2015   Osteoarthritis of multiple joints    Hyperlipemia 08/17/2009   Chronic venous insufficiency 07/23/2007   Recurrent cystitis 07/23/2007   Past Medical History:  Diagnosis Date   Allergy    Cystitis    recurrent   Hyperlipidemia    Osteoarthritis of multiple joints  Osteopenia    Squamous cell carcinoma 2000   right leg   Unspecified venous (peripheral) insufficiency    Past Surgical History:  Procedure Laterality Date   ABDOMINAL HYSTERECTOMY     CHOLECYSTECTOMY     COLONOSCOPY     NASAL SEPTUM SURGERY     Social History[1] Family History  Problem Relation Age of Onset   Arthritis Mother    Hypertension Mother    Ovarian cancer Maternal Aunt 74   Stroke Maternal Grandmother    Colon cancer Maternal Aunt 67   Breast cancer Neg Hx    Allergies[2] Medications Ordered Prior to Encounter[3]  Review of Systems  Constitutional:  Positive for chills and fatigue. Negative for activity change, appetite change, fever and unexpected weight change.  HENT:  Negative for congestion, ear pain, rhinorrhea, sinus pressure and sore throat.   Eyes:  Negative for pain, redness and visual disturbance.  Respiratory:  Negative for cough, shortness of  breath and wheezing.   Cardiovascular:  Positive for palpitations. Negative for chest pain and leg swelling.  Gastrointestinal:  Negative for abdominal pain, blood in stool, constipation and diarrhea.  Endocrine: Negative for polydipsia and polyuria.  Genitourinary:  Negative for dysuria, frequency and urgency.  Musculoskeletal:  Positive for arthralgias and myalgias. Negative for back pain.  Skin:  Negative for pallor and rash.  Allergic/Immunologic: Negative for environmental allergies.  Neurological:  Positive for dizziness and tremors. Negative for syncope and headaches.  Hematological:  Negative for adenopathy. Does not bruise/bleed easily.  Psychiatric/Behavioral:  Negative for agitation, decreased concentration and dysphoric mood. The patient is not nervous/anxious.        Objective:   Physical Exam Constitutional:      General: She is not in acute distress.    Appearance: Normal appearance. She is well-developed. She is obese. She is not ill-appearing or diaphoretic.  HENT:     Head: Normocephalic and atraumatic.     Mouth/Throat:     Mouth: Mucous membranes are moist.  Eyes:     Conjunctiva/sclera: Conjunctivae normal.     Pupils: Pupils are equal, round, and reactive to light.  Neck:     Thyroid : No thyromegaly.     Vascular: No carotid bruit or JVD.  Cardiovascular:     Rate and Rhythm: Normal rate and regular rhythm.     Heart sounds: Normal heart sounds.     No gallop.  Pulmonary:     Effort: Pulmonary effort is normal. No respiratory distress.     Breath sounds: Normal breath sounds. No wheezing or rales.  Abdominal:     General: There is no distension or abdominal bruit.     Palpations: Abdomen is soft.  Musculoskeletal:     Cervical back: Normal range of motion and neck supple.     Right lower leg: No edema.     Left lower leg: No edema.  Lymphadenopathy:     Cervical: No cervical adenopathy.  Skin:    General: Skin is warm and dry.     Coloration: Skin  is not jaundiced or pale.     Findings: No bruising or rash.  Neurological:     Mental Status: She is alert.     Coordination: Coordination normal.     Deep Tendon Reflexes: Reflexes are normal and symmetric. Reflexes normal.     Comments: Mild bilateral hand tremor of intention (not at rest)  Psychiatric:        Mood and Affect: Mood normal.  Assessment & Plan:   Problem List Items Addressed This Visit       Cardiovascular and Mediastinum   Hot flashes   Unsure if this is from  Prednisone  Stopping HRT in November   Labs pending for tsh as well        Relevant Orders   CBC with Differential/Platelet   Basic metabolic panel with GFR   TSH     Genitourinary   Stage 3a chronic kidney disease (HCC)   Bmet with lab today  Is well hydrated   No nsaids currently  Is on prednisone  for PMR      Relevant Orders   CBC with Differential/Platelet   Basic metabolic panel with GFR     Other   Tremor   ? Prednisone  side effects Hands Bilat  With intention   Lab today      Relevant Orders   CBC with Differential/Platelet   Basic metabolic panel with GFR   TSH   Prediabetes   Lab Results  Component Value Date   HGBA1C 5.7 11/13/2024   Stable On prednisone  disc imp of low glycemic diet and wt loss to prevent DM2       Polymyalgia rheumatica syndrome - Primary   Shoulder girdle and neck pain  Resolves with prednisone  (was weaned slowly but symptoms returned in December)  Now prednisone  15 mg  Symptoms are better but having significant side effects (we think) including increase blood pressure /tremulous/ hot flashes /chills  Denies anxiety however  Lab today  ? Consider rheum opinion       Relevant Orders   C-reactive protein   ANA   Rheumatoid factor   Sedimentation Rate   Pain of shoulder girdle   ? PMR  Is being treated with prednisone  (helps but side effects)   Esr/CRP today  Per pt -not elevated in past with her PMR        Relevant Orders   ANA   Rheumatoid factor   Menopausal syndrome   Pt did stop HRT in nov Now having hot flashes ? If this is from above vs prednisone  side effects       Joint pain   History of left knee pain and arthritis  Some shoulder girdle pain   Fam history of RA -mother and M uncle  Ana , RF, esr and crp today  No joint deformity on exam      Relevant Orders   ANA   Rheumatoid factor   Elevated BP reading w/ no diagnosis of HTN   Blood pressure is up today in setting of possible prednisone  side effect (includes feeling tremulous) Lab today  Will arrange follow up with pcp   Per pt -blood pressure is usually on the low side of normal      Relevant Orders   CBC with Differential/Platelet   Basic metabolic panel with GFR   TSH      [1]  Social History Tobacco Use   Smoking status: Former    Passive exposure: Past   Smokeless tobacco: Never  Substance Use Topics   Alcohol use: Yes    Alcohol/week: 0.0 standard drinks of alcohol    Comment: occassional   Drug use: No  [2]  Allergies Allergen Reactions   Nitrofurantoin     Pt denies  [3]  Current Outpatient Medications on File Prior to Visit  Medication Sig Dispense Refill   acetaminophen  (TYLENOL ) 500 MG tablet Take 1,000 mg by mouth 2 (two) times daily.  Calcium Carbonate-Vitamin D  (CALCIUM-D PO) Take by mouth.     CRANBERRY PO Take by mouth.     doxylamine, Sleep, (UNISOM) 25 MG tablet Take 12.5 mg by mouth at bedtime as needed.     Multiple Vitamin (MULTIVITAMIN) capsule Take 1 capsule by mouth daily.     predniSONE  (DELTASONE ) 5 MG tablet Take 4 tablets (20 mg total) by mouth daily with breakfast. Wean as directed (Patient taking differently: Take 2.5 mg by mouth daily with breakfast. Wean as directed) 120 tablet 3   estradiol  (ESTRACE ) 0.5 MG tablet TAKE 1 TABLET (0.5 MG TOTAL) BY MOUTH 2 (TWO) TIMES A WEEK. (Patient not taking: Reported on 11/24/2024) 24 tablet 2   No current  facility-administered medications on file prior to visit.   "

## 2024-11-24 NOTE — Assessment & Plan Note (Signed)
 Bmet with lab today  Is well hydrated   No nsaids currently  Is on prednisone  for PMR

## 2024-11-24 NOTE — Telephone Encounter (Signed)
 FYI Only or Action Required?: FYI only for provider: appointment scheduled on today.  Patient was last seen in primary care on 11/04/2024 by Bennett Reuben POUR, MD.  Called Nurse Triage reporting Tremors.  Symptoms began 2 weeks ago.  Interventions attempted: Other: self tapered down prednisone .  Symptoms are: unchanged.  Triage Disposition: See PCP When Office is Open (Within 3 Days)  Patient/caregiver understands and will follow disposition?: Yes   Reason for Disposition  [1] Muscle jerks or tics without an obvious cause AND [2] brief (seconds, gone now) AND [3] otherwise feels normal (well)  Answer Assessment - Initial Assessment Questions Per patient report she has been on Prednisone  taper for 1 year for Polymyalgia rheumatica syndrome, in December she was down to 2.5mg . when her pain increased she was put back on 20mg  Prednisone . About 2 weeks later she began experiencing chills, tremors, muscle aches, intermittent mild dizziness, and hot flashes. This has now been ongoing for two weeks. She self tapered prednisone  to 15mg  five days ago with some improvement. Has not taken prednisone  yet today.  Acute scheduled today.   1. APPEARANCE of MOVEMENT: What did the jerking or twitching look like? (e.g., body area)     Intermittent muscle twitching  2. ONSET: When did this start happening? (e.g., hours, days, weeks, months ago)     Two weeks ago  3. DURATION: How long does the jerk, twitch, or spasm last?     varies 4. FREQUENCY:  How often does this happen?      daily 5. WHEN: When does this happen? (e.g., while awake, while falling asleep, while sleeping)      6. CAUSE: What do you think caused the tremors, muscle aches?     Increased prednisone  7. OTHER SYMPTOMS: Are there any other symptoms? (e.g., fever, headache)     Intermittent dizziness-not currently, hot flashes  Protocols used: Muscle Jerks - Tics - [facility] Copied from CRM #[id number]. Topic: Clinical -  Red Word Triage >> Nov 24, 2024  9:30 AM Amber H wrote: [facility] that prompted transfer to Nurse Triage: Patient c/o  chills, tremors, hot flashes, muscles aches ,and occasional dizzy spells she has been taking  20 mg of prednisone  provider went up on her dosage. she has been having these side effects

## 2024-11-24 NOTE — Assessment & Plan Note (Signed)
?   PMR  Is being treated with prednisone  (helps but side effects)   Esr/CRP today  Per pt -not elevated in past with her PMR

## 2024-11-24 NOTE — Telephone Encounter (Signed)
 Aware, will watch for correspondence

## 2024-11-24 NOTE — Assessment & Plan Note (Signed)
 Pt did stop HRT in nov Now having hot flashes ? If this is from above vs prednisone  side effects

## 2024-11-24 NOTE — Assessment & Plan Note (Signed)
 Unsure if this is from  Prednisone  Stopping HRT in November   Labs pending for tsh as well

## 2024-11-24 NOTE — Assessment & Plan Note (Signed)
 Blood pressure is up today in setting of possible prednisone  side effect (includes feeling tremulous) Lab today  Will arrange follow up with pcp   Per pt -blood pressure is usually on the low side of normal

## 2024-11-24 NOTE — Telephone Encounter (Signed)
 Pt evaluated by Dr Randeen, pending lab results

## 2024-11-24 NOTE — Patient Instructions (Signed)
 Continue current medicines  Labs today   We reach out with a plan      Take care of yourself

## 2024-11-24 NOTE — Assessment & Plan Note (Signed)
 Lab Results  Component Value Date   HGBA1C 5.7 11/13/2024   Stable On prednisone  disc imp of low glycemic diet and wt loss to prevent DM2

## 2024-11-25 ENCOUNTER — Ambulatory Visit: Payer: Self-pay | Admitting: Family Medicine

## 2024-11-25 DIAGNOSIS — M353 Polymyalgia rheumatica: Secondary | ICD-10-CM

## 2024-11-25 DIAGNOSIS — M25562 Pain in left knee: Secondary | ICD-10-CM

## 2024-11-25 DIAGNOSIS — R7689 Other specified abnormal immunological findings in serum: Secondary | ICD-10-CM

## 2024-11-25 LAB — CBC WITH DIFFERENTIAL/PLATELET
Basophils Absolute: 0.1 K/uL (ref 0.0–0.1)
Basophils Relative: 0.6 % (ref 0.0–3.0)
Eosinophils Absolute: 0 K/uL (ref 0.0–0.7)
Eosinophils Relative: 0.1 % (ref 0.0–5.0)
HCT: 44.2 % (ref 36.0–46.0)
Hemoglobin: 14.7 g/dL (ref 12.0–15.0)
Lymphocytes Relative: 8.3 % — ABNORMAL LOW (ref 12.0–46.0)
Lymphs Abs: 1.3 K/uL (ref 0.7–4.0)
MCHC: 33.3 g/dL (ref 30.0–36.0)
MCV: 91.7 fl (ref 78.0–100.0)
Monocytes Absolute: 0.7 K/uL (ref 0.1–1.0)
Monocytes Relative: 4.4 % (ref 3.0–12.0)
Neutro Abs: 13.7 K/uL — ABNORMAL HIGH (ref 1.4–7.7)
Neutrophils Relative %: 86.6 % — ABNORMAL HIGH (ref 43.0–77.0)
Platelets: 417 K/uL — ABNORMAL HIGH (ref 150.0–400.0)
RBC: 4.82 Mil/uL (ref 3.87–5.11)
RDW: 13.7 % (ref 11.5–15.5)
WBC: 15.9 K/uL — ABNORMAL HIGH (ref 4.0–10.5)

## 2024-11-25 LAB — BASIC METABOLIC PANEL WITH GFR
BUN: 28 mg/dL — ABNORMAL HIGH (ref 6–23)
CO2: 29 meq/L (ref 19–32)
Calcium: 10.2 mg/dL (ref 8.4–10.5)
Chloride: 100 meq/L (ref 96–112)
Creatinine, Ser: 1.21 mg/dL — ABNORMAL HIGH (ref 0.40–1.20)
GFR: 42.85 mL/min — ABNORMAL LOW
Glucose, Bld: 123 mg/dL — ABNORMAL HIGH (ref 70–99)
Potassium: 4.7 meq/L (ref 3.5–5.1)
Sodium: 136 meq/L (ref 135–145)

## 2024-11-25 LAB — TSH: TSH: 1.51 u[IU]/mL (ref 0.35–5.50)

## 2024-11-25 LAB — C-REACTIVE PROTEIN: CRP: 8.9 mg/dL (ref 1.0–20.0)

## 2024-11-26 LAB — ANTI-NUCLEAR AB-TITER (ANA TITER): ANA Titer 1: 1:40 {titer} — ABNORMAL HIGH

## 2024-11-26 LAB — SEDIMENTATION RATE: Sed Rate: 19 mm/h (ref 0–30)

## 2024-11-26 LAB — RHEUMATOID FACTOR: Rheumatoid fact SerPl-aCnc: 39 [IU]/mL — ABNORMAL HIGH

## 2024-11-26 LAB — ANA: Anti Nuclear Antibody (ANA): POSITIVE — AB

## 2024-11-30 DIAGNOSIS — R7689 Other specified abnormal immunological findings in serum: Secondary | ICD-10-CM | POA: Insufficient documentation

## 2024-12-02 ENCOUNTER — Ambulatory Visit (INDEPENDENT_AMBULATORY_CARE_PROVIDER_SITE_OTHER)

## 2024-12-02 VITALS — Ht 66.75 in | Wt 185.0 lb

## 2024-12-02 DIAGNOSIS — Z0000 Encounter for general adult medical examination without abnormal findings: Secondary | ICD-10-CM

## 2024-12-02 NOTE — Patient Instructions (Signed)
 Sierra Salinas,  Thank you for taking the time for your Medicare Wellness Visit. I appreciate your continued commitment to your health goals. Please review the care plan we discussed, and feel free to reach out if I can assist you further.  Please note that Annual Wellness Visits do not include a physical exam. Some assessments may be limited, especially if the visit was conducted virtually. If needed, we may recommend an in-person follow-up with your provider.  Ongoing Care Seeing your primary care provider every 3 to 6 months helps us  monitor your health and provide consistent, personalized care.   Referrals If a referral was made during today's visit and you haven't received any updates within two weeks, please contact the referred provider directly to check on the status.  Recommended Screenings:  Health Maintenance  Topic Date Due   Medicare Annual Wellness Visit  09/16/2024   DTaP/Tdap/Td vaccine (3 - Td or Tdap) 09/17/2025*   COVID-19 Vaccine (3 - Pfizer risk series) 09/17/2025*   Pneumococcal Vaccine for age over 40  Completed   Flu Shot  Completed   Osteoporosis screening with Bone Density Scan  Completed   Hepatitis C Screening  Completed   Zoster (Shingles) Vaccine  Completed   Meningitis B Vaccine  Aged Out   Breast Cancer Screening  Discontinued   Colon Cancer Screening  Discontinued  *Topic was postponed. The date shown is not the original due date.       12/02/2024    9:39 AM  Advanced Directives  Does Patient Have a Medical Advance Directive? Yes  Type of Estate Agent of [place];Living will  Does patient want to make changes to medical advance directive? No - Patient declined  Copy of Healthcare Power of Attorney in Chart? No - copy requested    Vision: Annual vision screenings are recommended for early detection of glaucoma, cataracts, and diabetic retinopathy. These exams can also reveal signs of chronic conditions such as diabetes and  high blood pressure.  Dental: Annual dental screenings help detect early signs of oral cancer, gum disease, and other conditions linked to overall health, including heart disease and diabetes.  Please see the attached documents for additional preventive care recommendations.

## 2024-12-02 NOTE — Progress Notes (Signed)
 "  Chief Complaint  Patient presents with   Medicare Wellness     Subjective:   Sierra Salinas is a 79 y.o. female who presents for a Medicare Annual Wellness Visit.  Visit info / Clinical Intake: Medicare Wellness Visit Type:: Subsequent Annual Wellness Visit Persons participating in visit and providing information:: patient Medicare Wellness Visit Mode:: Telephone If telephone:: video declined Since this visit was completed virtually, some vitals may be partially provided or unavailable. Missing vitals are due to the limitations of the virtual format.: Unable to obtain vitals - no equipment If Telephone or Video please confirm:: I connected with patient using audio/video enable telemedicine. I verified patient identity with two identifiers, discussed telehealth limitations, and patient agreed to proceed. Patient Location:: home Provider Location:: office Interpreter Needed?: No Pre-visit prep was completed: yes AWV questionnaire completed by patient prior to visit?: no Living arrangements:: (!) lives alone Patient's Overall Health Status Rating: (!) fair Typical amount of pain: some Does pain affect daily life?: (!) yes Are you currently prescribed opioids?: no  Dietary Habits and Nutritional Risks How many meals a day?: 2 Eats fruit and vegetables daily?: yes (sometimes) Most meals are obtained by: preparing own meals; eating out In the last 2 weeks, have you had any of the following?: none Diabetic:: no  Functional Status Activities of Daily Living (to include ambulation/medication): Independent Ambulation: Independent Medication Administration: Independent Home Management (perform basic housework or laundry): Independent Manage your own finances?: yes Primary transportation is: driving Concerns about vision?: no *vision screening is required for WTM* Concerns about hearing?: no  Fall Screening Falls in the past year?: 0 Number of falls in past year: 0 Was there  an injury with Fall?: 0 Fall Risk Category Calculator: 0 Patient Fall Risk Level: Low Fall Risk  Fall Risk Patient at Risk for Falls Due to: No Fall Risks Fall risk Follow up: Falls evaluation completed; Education provided; Falls prevention discussed  Home and Transportation Safety: All rugs have non-skid backing?: yes All stairs or steps have railings?: yes Grab bars in the bathtub or shower?: yes Have non-skid surface in bathtub or shower?: yes Good home lighting?: yes Regular seat belt use?: yes Hospital stays in the last year:: no  Cognitive Assessment Difficulty concentrating, remembering, or making decisions? : no Will 6CIT or Mini Cog be Completed: yes What year is it?: 0 points What month is it?: 0 points Give patient an address phrase to remember (5 components): [address] [place]  About what time is it?: 0 points Count backwards from 20 to 1: 0 points Say the months of the year in reverse: 0 points Repeat the address phrase from earlier: 0 points 6 CIT Score: 0 points  Advance Directives (For Healthcare) Does Patient Have a Medical Advance Directive?: Yes Does patient want to make changes to medical advance directive?: No - Patient declined Type of Advance Directive: Healthcare Power of [place]; Living will Copy of Healthcare Power of Attorney in Chart?: No - copy requested Copy of Living Will in Chart?: No - copy requested  Reviewed/Updated  Reviewed/Updated: Reviewed All (Medical, Surgical, Family, Medications, Allergies, Care Teams, Patient Goals)    Allergies (verified) Nitrofurantoin   Current Medications (verified) Outpatient Encounter Medications as of 12/02/2024  Medication Sig   predniSONE  (DELTASONE ) 5 MG tablet Take 4 tablets (20 mg total) by mouth daily with breakfast. Wean as directed (Patient taking differently: Take 20 mg by mouth daily with breakfast. Wean as directed;pt at 10mg  daily  12/01/24)  acetaminophen  (TYLENOL ) 500 MG  tablet Take 1,000 mg by mouth 2 (two) times daily.   Calcium Carbonate-Vitamin D  (CALCIUM-D PO) Take by mouth.   CRANBERRY PO Take by mouth.   doxylamine, Sleep, (UNISOM) 25 MG tablet Take 12.5 mg by mouth at bedtime as needed.   estradiol  (ESTRACE ) 0.5 MG tablet TAKE 1 TABLET (0.5 MG TOTAL) BY MOUTH 2 (TWO) TIMES A WEEK. (Patient not taking: Reported on 11/24/2024)   Multiple Vitamin (MULTIVITAMIN) capsule Take 1 capsule by mouth daily.   No facility-administered encounter medications on file as of 12/02/2024.    History: Past Medical History:  Diagnosis Date   Allergy    Cystitis    recurrent   Hyperlipidemia    Osteoarthritis of multiple joints    Osteopenia    Squamous cell carcinoma 2000   right leg   Unspecified venous (peripheral) insufficiency    Past Surgical History:  Procedure Laterality Date   ABDOMINAL HYSTERECTOMY     CHOLECYSTECTOMY     COLONOSCOPY     NASAL SEPTUM SURGERY     Family History  Problem Relation Age of Onset   Arthritis Mother    Hypertension Mother    Ovarian cancer Maternal Aunt 71   Stroke Maternal Grandmother    Colon cancer Maternal Aunt 67   Breast cancer Neg Hx    Social History   Occupational History   Occupation: manufacturing engineer    Comment: Retired  Tobacco Use   Smoking status: Former    Passive exposure: Past   Smokeless tobacco: Never  Substance and Sexual Activity   Alcohol use: Yes    Alcohol/week: 0.0 standard drinks of alcohol    Comment: occassional   Drug use: No   Sexual activity: Not on file   Tobacco Counseling Counseling given: Not Answered  SDOH Screenings   Food Insecurity: No Food Insecurity (12/02/2024)  Housing: Unknown (12/02/2024)  Transportation Needs: No Transportation Needs (12/02/2024)  Utilities: Not At Risk (12/02/2024)  Alcohol Screen: Low Risk (12/02/2024)  Depression (PHQ2-9): Low Risk (12/02/2024)  Financial Resource Strain: Low Risk (12/02/2024)  Physical Activity: Inactive (12/02/2024)   Social Connections: Moderately Integrated (12/02/2024)  Stress: No Stress Concern Present (12/02/2024)  Tobacco Use: Medium Risk (12/02/2024)  Health Literacy: Adequate Health Literacy (12/02/2024)   See flowsheets for full screening details  Depression Screen PHQ 2 & 9 Depression Scale- Over the past 2 weeks, how often have you been bothered by any of the following problems? Little interest or pleasure in doing things: 0 Feeling down, depressed, or hopeless (PHQ Adolescent also includes...irritable): 0 PHQ-2 Total Score: 0 Trouble falling or staying asleep, or sleeping too much: 0 Feeling tired or having little energy: 0 Poor appetite or overeating (PHQ Adolescent also includes...weight loss): 0 Feeling bad about yourself - or that you are a failure or have let yourself or your family down: 0 Trouble concentrating on things, such as reading the newspaper or watching television (PHQ Adolescent also includes...like school work): 0 Moving or speaking so slowly that other people could have noticed. Or the opposite - being so fidgety or restless that you have been moving around a lot more than usual: 0 Thoughts that you would be better off dead, or of hurting yourself in some way: 0 PHQ-9 Total Score: 0 If you checked off any problems, how difficult have these problems made it for you to do your work, take care of things at home, or get along with other people?: Not difficult at all  Goals Addressed             This Visit's Progress    I want to get off of Prednisone                Objective:    Today's Vitals   12/02/24 0935  Weight: 185 lb (83.9 kg)  Height: 5' 6.75 (1.695 m)   Body mass index is 29.19 kg/m.  Hearing/Vision screen No results found. Immunizations and Health Maintenance Health Maintenance  Topic Date Due   Medicare Annual Wellness (AWV)  09/16/2024   DTaP/Tdap/Td (3 - Td or Tdap) 09/17/2025 (Originally 02/17/2024)   COVID-19 Vaccine (3 - Pfizer risk  series) 09/17/2025 (Originally 03/01/2020)   Pneumococcal Vaccine: 50+ Years  Completed   Influenza Vaccine  Completed   Bone Density Scan  Completed   Hepatitis C Screening  Completed   Zoster Vaccines- Shingrix  Completed   Meningococcal B Vaccine  Aged Out   Mammogram  Discontinued   Colonoscopy  Discontinued        Assessment/Plan:  This is a routine wellness examination for Sierra Salinas.  Patient Care Team: Bennett Reuben POUR, MD as PCP - General (Family Medicine)  I have personally reviewed and noted the following in the patients chart:   Medical and social history Use of alcohol, tobacco or illicit drugs  Current medications and supplements including opioid prescriptions. Functional ability and status Nutritional status Physical activity Advanced directives List of other physicians Hospitalizations, surgeries, and ER visits in previous 12 months Vitals Screenings to include cognitive, depression, and falls Referrals and appointments  No orders of the defined types were placed in this encounter.  In addition, I have reviewed and discussed with patient certain preventive protocols, quality metrics, and best practice recommendations. A written personalized care plan for preventive services as well as general preventive health recommendations were provided to patient.   Erminio LITTIE Saris, LPN   [id number]    After Visit Summary: (MyChart) Due to this being a telephonic visit, the after visit summary with patients personalized plan was offered to patient via MyChart   Nurse Notes: No voiced or noted concerns at this time Patient advised to keep follow-up appointment with PCP (12/08/24)  "

## 2024-12-08 ENCOUNTER — Encounter

## 2025-01-11 ENCOUNTER — Encounter

## 2025-01-15 ENCOUNTER — Encounter

## 2025-03-01 ENCOUNTER — Ambulatory Visit: Admitting: Internal Medicine

## 2025-12-09 ENCOUNTER — Encounter

## 2025-12-09 ENCOUNTER — Ambulatory Visit

## 2025-12-14 ENCOUNTER — Encounter
